# Patient Record
Sex: Female | Born: 1985 | Hispanic: Refuse to answer | Smoking: Never smoker
Health system: Southern US, Community
[De-identification: ages and names within clinical notes are randomized; demographics above are authoritative.]

## PROBLEM LIST (undated history)

## (undated) DIAGNOSIS — E282 Polycystic ovarian syndrome: Secondary | ICD-10-CM

## (undated) DIAGNOSIS — K8020 Calculus of gallbladder without cholecystitis without obstruction: Secondary | ICD-10-CM

## (undated) DIAGNOSIS — R87629 Unspecified abnormal cytological findings in specimens from vagina: Secondary | ICD-10-CM

## (undated) DIAGNOSIS — M329 Systemic lupus erythematosus, unspecified: Secondary | ICD-10-CM

## (undated) DIAGNOSIS — I493 Ventricular premature depolarization: Secondary | ICD-10-CM

## (undated) HISTORY — DX: Systemic lupus erythematosus, unspecified: M32.9

## (undated) HISTORY — PX: CYST EXCISION: SHX5701

## (undated) HISTORY — DX: Ventricular premature depolarization: I49.3

## (undated) HISTORY — DX: Calculus of gallbladder without cholecystitis without obstruction: K80.20

## (undated) HISTORY — DX: Polycystic ovarian syndrome: E28.2

## (undated) HISTORY — DX: Unspecified abnormal cytological findings in specimens from vagina: R87.629

---
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
## 2007-02-05 ENCOUNTER — Inpatient Hospital Stay (HOSPITAL_COMMUNITY): Admission: AD | Admit: 2007-02-05 | Discharge: 2007-02-07 | Payer: Self-pay | Admitting: Family Medicine

## 2007-02-05 ENCOUNTER — Ambulatory Visit: Payer: Self-pay | Admitting: Obstetrics and Gynecology

## 2009-07-08 ENCOUNTER — Emergency Department (HOSPITAL_COMMUNITY): Admission: EM | Admit: 2009-07-08 | Discharge: 2009-07-08 | Payer: Self-pay | Admitting: Emergency Medicine

## 2010-07-29 LAB — URINE MICROSCOPIC-ADD ON

## 2010-07-29 LAB — BASIC METABOLIC PANEL
BUN: 3 mg/dL — ABNORMAL LOW (ref 6–23)
CO2: 30 mEq/L (ref 19–32)
Calcium: 8.9 mg/dL (ref 8.4–10.5)
Chloride: 100 mEq/L (ref 96–112)
Creatinine, Ser: 0.72 mg/dL (ref 0.4–1.2)
GFR calc Af Amer: >60 mL/min (ref >60)
GFR calc non Af Amer: >60 mL/min (ref >60)
Glucose, Bld: 91 mg/dL (ref 70–99)
Potassium: 3.6 mEq/L (ref 3.5–5.1)
Sodium: 140 mEq/L (ref 135–145)

## 2010-07-29 LAB — DIFFERENTIAL
Basophils Absolute: 0 10*3/uL (ref 0.0–0.1)
Basophils Relative: 0 % (ref 0–1)
Eosinophils Absolute: 0.1 10*3/uL (ref 0.0–0.7)
Neutro Abs: 3.9 10*3/uL (ref 1.7–7.7)
Neutrophils Relative %: 77 % (ref 43–77)

## 2010-07-29 LAB — WET PREP, GENITAL: Yeast Wet Prep HPF POC: NONE SEEN

## 2010-07-29 LAB — URINALYSIS, ROUTINE W REFLEX MICROSCOPIC
Bilirubin Urine: NEGATIVE
Glucose, UA: NEGATIVE mg/dL
Ketones, ur: NEGATIVE mg/dL
Leukocytes, UA: NEGATIVE
Nitrite: NEGATIVE
Protein, ur: NEGATIVE mg/dL
Specific Gravity, Urine: 1.01 (ref 1.005–1.030)
Urobilinogen, UA: 0.2 mg/dL (ref 0.0–1.0)
pH: 6 (ref 5.0–8.0)

## 2010-07-29 LAB — CBC
MCHC: 34.1 g/dL (ref 30.0–36.0)
RDW: 13.8 % (ref 11.5–15.5)

## 2010-07-29 LAB — GC/CHLAMYDIA PROBE AMP, GENITAL: Chlamydia, DNA Probe: NEGATIVE

## 2010-07-29 LAB — PREGNANCY, URINE: Preg Test, Ur: NEGATIVE

## 2010-09-21 NOTE — Op Note (Signed)
NAMESHARDE, GOVER               ACCOUNT NO.:  [id number]   MEDICAL RECORD NO.:  [id number]          PATIENT TYPE:  INP   LOCATION:  LDR3                          FACILITY:  [facility]   PHYSICIAN:  Tilda Burrow, M.D. DATE OF BIRTH:  03-13-1986   DATE OF PROCEDURE:  01/29/2005  DATE OF DISCHARGE:                                 OPERATIVE REPORT   DETAILS OF PROCEDURE:  Epidural catheter placement. Continuous lumbar  epidural catheter placed on January 29, 2005 at 7:45 p.m. The patient had  consented to the procedure. Questions were answered to patient's  satisfaction. The patient placed in a sitting position, flexed forward,  local anesthesia applied, after prepping and draping of the back. Loss-of-  resistance technique was used. A continuous lumbar epidural catheter was  placed, with epidural space identified at 4.5 cm beneath the skin. A 5 cc  test dose of 1.5% lidocaine with epinephrine was instilled followed by  insertion of the epidural catheter 3 cm into the epidural space. Removal of  the Tuohy needle, followed by attachment to continuous infusion with a 9-cc  bolus of 0.125% Marcaine solution with 2 mcg/mL of fentanyl. We also  subsequently administered 12 cc per hour as continuous infusion. The patient  had good analgesic effect, allowed cervical exam without difficulty, and  cervix could be stretched to 4 cm manually. Vertex was showing some molding.  Presenting part is at -2 station.   IMPRESSION:  Prognosis for delivery is improved with molding and slow  progress. Will monitor for adequacy of labor. Dr. Despina Hidden to be managing  patient shortly.      Tilda Burrow, M.D.  Electronically Signed     JVF/MEDQ  D:  01/29/2005  T:  01/30/2005  Job:  [id number]

## 2010-09-21 NOTE — Procedures (Signed)
[facility]  Patient:    Kathleen Chan, Kathleen Chan Visit Number: [id number] MRN: [id number]          Service Type: EMS Location: ED Attending Physician:  Rosalyn Charters Dictated by:   Kari Baars, M.D. Proc. Date: 11/05/01 Admit Date:  11/05/2001 Discharge Date: 11/05/2001                            EKG Interpretations  The rhythm is a sinus rhythm with a rate in the 70s.  Voltage is minimally high.  There are small Q-waves inferiorly and laterally which are probably of no significance but clinical correlation is suggested.  Borderline electrocardiogram. Dictated by:   Kari Baars, M.D. Attending Physician:  Rosalyn Charters DD:  11/06/01 TD:  11/10/01 Job: [id number]

## 2010-09-21 NOTE — H&P (Signed)
[name], [name]               ACCOUNT NO.:  [id number]   MEDICAL RECORD NO.:  [id number]          PATIENT TYPE:  INP   LOCATION:  LDR3                          FACILITY:  [facility]   PHYSICIAN:  Tilda Burrow, M.D. DATE OF BIRTH:  October 14, 1985   DATE OF ADMISSION:  01/29/2005  DATE OF DISCHARGE:  LH                                HISTORY & PHYSICAL   HISTORY OF PRESENT ILLNESS:  Kathleen Chan is an 25 year old gravida 1, para 0,  EDC February 10, 2007, at 38 weeks and 3 days with spontaneous rupture of  membranes at approximately 11 o'clock last night who had gross leaking upon  admission.   PAST MEDICAL HISTORY:  Negative.   PAST SURGICAL HISTORY:  Cyst removed under her arm when she was in the first  grade.   ALLERGIES:  FLONASE, KEFLEX GIVES HER ORAL YEAST.   MEDICATIONS:  Prenatal vitamins.   SOCIAL HISTORY:  She is married, and her family is present and supportive.   FAMILY HISTORY:  Positive for hypertension and diabetes.   PHYSICAL EXAMINATION:  Vital signs are stable.  Fetal heart rate pattern is  stable with accelerations clear.  Fluid noted upon exam. Cervix is 1 cm  thick.  Presenting part is at least -2 and is vertex.   PLAN:  Will admit.  ___________ for labor because she is having very  prodromal labor pattern, if any.  Possible IV antibiotics for prophylaxis  due to prolonged rupture.      Kathleen Chan, Kathleen Chan      Tilda Burrow, M.D.  Electronically Signed    DL/MEDQ  D:  16/02/9603  T:  01/29/2005  Job:  [id number]   cc:   [facility]

## 2010-09-21 NOTE — Op Note (Signed)
NAMEDESHARA, Chan               ACCOUNT NO.:  [id number]   MEDICAL RECORD NO.:  [id number]          PATIENT TYPE:  INP   LOCATION:  A401                          FACILITY:  [facility]   PHYSICIAN:  Lazaro Arms, M.D.   DATE OF BIRTH:  08-09-1985   DATE OF PROCEDURE:  01/30/2005  DATE OF DISCHARGE:  02/01/2005                                 OPERATIVE REPORT   DELIVERY NOTE:  The patient progressed normally through labor. She was found  to be complete. She is nulliparous at 38-1/[redacted] weeks gestation, spontaneous  rupture of membranes. Again, she had an epidural placed. She was getting  good pain relief. She was delivered over an intact peritoneum on January 30, 2005 at 0228. The infant had Apgars of 9 and 9. There was a three-vessel  cord. The infant weighed 5 pounds 11 ounces. There was a loose nuchal cord  x1. The infant underwent routine neonatal resuscitation. There was a right  periurethral laceration which was repaired with 3-0 Monocryl without  difficulty. The placenta was delivered spontaneously. The uterus was firm,  and there was appropriate bleeding. The patient will undergo routine  postpartum care. The epidural catheter was removed, the blue tip intact.      Lazaro Arms, M.D.  Electronically Signed     LHE/MEDQ  D:  04/03/2005  T:  04/04/2005  Job:  [id number]

## 2011-02-14 LAB — CBC
HCT: 33.7 — ABNORMAL LOW
Hemoglobin: 11.2 — ABNORMAL LOW
Hemoglobin: 9.8 — ABNORMAL LOW
MCHC: 33.5
MCV: 81.9
MCV: 82.5
Platelets: 182
RDW: 15.3 — ABNORMAL HIGH
RDW: 15.4 — ABNORMAL HIGH
WBC: 10

## 2013-08-04 ENCOUNTER — Ambulatory Visit (INDEPENDENT_AMBULATORY_CARE_PROVIDER_SITE_OTHER): Payer: Managed Care, Other (non HMO) | Admitting: General Practice

## 2013-08-04 ENCOUNTER — Encounter: Payer: Self-pay | Admitting: General Practice

## 2013-08-04 ENCOUNTER — Telehealth: Payer: Self-pay | Admitting: Family Medicine

## 2013-08-04 VITALS — BP 99/63 | HR 55 | Temp 97.4°F | Ht 60.0 in | Wt 199.0 lb

## 2013-08-04 DIAGNOSIS — S161XXA Strain of muscle, fascia and tendon at neck level, initial encounter: Secondary | ICD-10-CM

## 2013-08-04 DIAGNOSIS — S46819A Strain of other muscles, fascia and tendons at shoulder and upper arm level, unspecified arm, initial encounter: Secondary | ICD-10-CM

## 2013-08-04 DIAGNOSIS — S43499A Other sprain of unspecified shoulder joint, initial encounter: Secondary | ICD-10-CM

## 2013-08-04 DIAGNOSIS — S139XXA Sprain of joints and ligaments of unspecified parts of neck, initial encounter: Secondary | ICD-10-CM

## 2013-08-04 MED ORDER — CYCLOBENZAPRINE HCL 10 MG PO TABS: 10.0000 mg | ORAL_TABLET | Freq: Two times a day (BID) | ORAL | Status: DC | PRN

## 2013-08-04 NOTE — Telephone Encounter (Signed)
appt given  

## 2013-08-04 NOTE — Progress Notes (Signed)
   Subjective:    Patient ID: Kathleen DodgeHeather F Chan, female    DOB: [birth year], 28 y.o.   MRN: [id number]  HPI Presents today with complaints of neck and upper back pain. Reports carrying a book bag on right shoulder, walking up stairs and noticed pain shortly after. Reports upper back muscle tightness.     Review of Systems  Constitutional: Negative for fever and chills.  Respiratory: Negative for chest tightness and shortness of breath.   Cardiovascular: Negative for chest pain and palpitations.  Musculoskeletal:       Upper back and neck pain       Objective:   Physical Exam  Constitutional: She is oriented to person, place, and time. She appears well-developed and well-nourished.  Cardiovascular: Normal rate, regular rhythm and normal heart sounds.   Pulmonary/Chest: Effort normal and breath sounds normal. No respiratory distress. She exhibits no tenderness.  Musculoskeletal: She exhibits tenderness.  Trapezius muscle tenderness and tightness noted  Neurological: She is alert and oriented to person, place, and time.  Skin: Skin is warm and dry.  Psychiatric: She has a normal mood and affect.          Assessment & Plan:  1. Neck muscle strain, 2. Trapezius muscle strain - cyclobenzaprine (FLEXERIL) 10 MG tablet; Take 1 tablet (10 mg total) by mouth 2 (two) times daily as needed for muscle spasms.  Dispense: 20 tablet; Refill: 0 -home care instructions provided and discussed -RTO if symptoms worsen or unresolved Patient verbalized understanding Coralie KeensMae E. Tobi Groesbeck, FNP-C

## 2013-08-04 NOTE — Patient Instructions (Signed)

## 2013-08-09 ENCOUNTER — Telehealth: Payer: Self-pay | Admitting: General Practice

## 2013-08-09 ENCOUNTER — Encounter: Payer: Self-pay | Admitting: General Practice

## 2013-08-09 NOTE — Telephone Encounter (Signed)
Patient neck is popping and hurting do you want a referral set up or do you want her to come back in

## 2013-08-09 NOTE — Telephone Encounter (Signed)
Note ready for pick up 

## 2013-09-01 ENCOUNTER — Encounter: Payer: Self-pay | Admitting: Family Medicine

## 2013-09-01 ENCOUNTER — Ambulatory Visit (INDEPENDENT_AMBULATORY_CARE_PROVIDER_SITE_OTHER): Payer: Managed Care, Other (non HMO) | Admitting: Family Medicine

## 2013-09-01 VITALS — BP 109/67 | HR 77 | Temp 98.3°F | Ht 60.0 in | Wt 195.6 lb

## 2013-09-01 DIAGNOSIS — G43909 Migraine, unspecified, not intractable, without status migrainosus: Secondary | ICD-10-CM

## 2013-09-01 DIAGNOSIS — J329 Chronic sinusitis, unspecified: Secondary | ICD-10-CM

## 2013-09-01 DIAGNOSIS — M542 Cervicalgia: Secondary | ICD-10-CM

## 2013-09-01 MED ORDER — KETOROLAC TROMETHAMINE 30 MG/ML IJ SOLN
30.0000 mg | Freq: Once | INTRAMUSCULAR | Status: AC
  Administered 2013-09-01: 30 mg via INTRAMUSCULAR

## 2013-09-01 MED ORDER — AMOXICILLIN 875 MG PO TABS: 875.0000 mg | ORAL_TABLET | Freq: Two times a day (BID) | ORAL | Status: DC

## 2013-09-01 MED ORDER — HYDROCODONE-ACETAMINOPHEN 5-325 MG PO TABS: 1.0000 | ORAL_TABLET | Freq: Four times a day (QID) | ORAL | Status: DC | PRN

## 2013-09-01 MED ORDER — SUMATRIPTAN SUCCINATE 100 MG PO TABS: 100.0000 mg | ORAL_TABLET | ORAL | Status: DC | PRN

## 2013-09-01 MED ORDER — ONDANSETRON 8 MG PO TBDP: 8.0000 mg | ORAL_TABLET | Freq: Three times a day (TID) | ORAL | Status: DC | PRN

## 2013-09-01 NOTE — Progress Notes (Signed)
   Subjective:    Patient ID: Elinor DodgeHeather F Diniz, female    DOB: Jun 26, 1985, 28 y.o.   MRN: [id number]  HPI  This 28 y.o. female presents for evaluation of migraine headaches.  She does not have hx of headaches but has been having several over past 2 weeks.  She c/o pain in her teeth, pressure behind her eyes and neck discomfort.  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  She has been having pounding headache and has phonophotophobia and nausea.  She has had to stop working today and has come in for a check up.  Review of Systems C/o headache and cervicalgia   No chest pain, SOB, HA, dizziness, vision change, N/V, diarrhea, constipation, dysuria, urinary urgency or frequency, myalgias, arthralgias or rash.  Objective:   Physical Exam Vital signs noted  Well developed well nourished female.  HEENT - Head atraumatic Normocephalic                Throat - oropharanx wnl Respiratory - Lungs CTA bilateral Cardiac - RRR S1 and S2 without murmur GI - Abdomen soft Nontender and bowel sounds active x 4 Extremities - No edema. Neuro - Grossly intact. MS - TTP myofascial region and cervical spine paraspinous muscles      Assessment & Plan:  Sinusitis - Plan: amoxicillin (AMOXIL) 875 MG tablet  Migraine - Plan: HYDROcodone-acetaminophen (NORCO) 5-325 MG per tablet, ondansetron (ZOFRAN ODT) 8 MG disintegrating tablet, SUMAtriptan (IMITREX) 100 MG tablet, ketorolac (TORADOL) 30 MG/ML injection 30 mg  Cervicalgia - Plan: ketorolac (TORADOL) 30 MG/ML injection 30 mg.  Discussed taking motrin 600mg  one po tid for a week.  Deatra CanterWilliam J Kaj Vasil FNP

## 2014-01-13 ENCOUNTER — Ambulatory Visit (INDEPENDENT_AMBULATORY_CARE_PROVIDER_SITE_OTHER): Payer: Managed Care, Other (non HMO) | Admitting: Family Medicine

## 2014-01-13 ENCOUNTER — Encounter: Payer: Self-pay | Admitting: Family Medicine

## 2014-01-13 VITALS — BP 109/66 | HR 86 | Temp 97.9°F | Ht 60.0 in | Wt 195.0 lb

## 2014-01-13 DIAGNOSIS — J0111 Acute recurrent frontal sinusitis: Secondary | ICD-10-CM

## 2014-01-13 DIAGNOSIS — J0110 Acute frontal sinusitis, unspecified: Secondary | ICD-10-CM

## 2014-01-13 MED ORDER — LEVOFLOXACIN 500 MG PO TABS: 500.0000 mg | ORAL_TABLET | Freq: Every day | ORAL | Status: DC

## 2014-01-13 MED ORDER — METHYLPREDNISOLONE (PAK) 4 MG PO TABS: ORAL_TABLET | ORAL | Status: DC

## 2014-01-13 NOTE — Progress Notes (Signed)
   Subjective:    Patient ID: Kathleen Chan, female    DOB: 1985/12/17, 28 y.o.   MRN: [id number]  HPI C/o sinus pressure uri and migraine headache.  She missed work Tuesday 01/11/14 and today. She needs note for work.   Review of Systems C/o sinus headache   No chest pain, SOB, HA, dizziness, vision change, N/V, diarrhea, constipation, dysuria, urinary urgency or frequency, myalgias, arthralgias or rash.  Objective:   Physical Exam Vital signs noted  Well developed well nourished female.  HEENT - Head atraumatic Normocephalic                Eyes - PERRLA, Conjuctiva - clear Sclera- Clear EOMI                Ears - EAC's Wnl TM's Wnl Gross Hearing WNL                Throat - oropharanx wnl Respiratory - Lungs CTA bilateral Cardiac - RRR S1 and S2 without murmur GI - Abdomen soft Nontender and bowel sounds active x 4        Assessment & Plan:  Acute recurrent frontal sinusitis - Plan: levofloxacin (LEVAQUIN) 500 MG tablet, methylPREDNIsolone (MEDROL DOSPACK) 4 MG tablet Note for 01/11/14 and today given to patient.  Deatra Canter FNP

## 2014-06-09 ENCOUNTER — Ambulatory Visit: Payer: Managed Care, Other (non HMO) | Admitting: Family Medicine

## 2014-06-10 ENCOUNTER — Encounter: Payer: Self-pay | Admitting: Family Medicine

## 2014-06-10 ENCOUNTER — Ambulatory Visit (INDEPENDENT_AMBULATORY_CARE_PROVIDER_SITE_OTHER): Payer: Managed Care, Other (non HMO) | Admitting: Family Medicine

## 2014-06-10 VITALS — BP 122/80 | HR 109 | Temp 97.8°F | Ht 60.0 in | Wt 196.6 lb

## 2014-06-10 DIAGNOSIS — L03119 Cellulitis of unspecified part of limb: Secondary | ICD-10-CM

## 2014-06-10 MED ORDER — CEFTRIAXONE SODIUM 1 G IJ SOLR
1.0000 g | INTRAMUSCULAR | Status: DC
  Administered 2014-06-10: 1 g via INTRAMUSCULAR

## 2014-06-10 MED ORDER — SULFAMETHOXAZOLE-TRIMETHOPRIM 800-160 MG PO TABS: 1.0000 | ORAL_TABLET | Freq: Two times a day (BID) | ORAL | Status: DC

## 2014-06-10 MED ORDER — CYANOCOBALAMIN 1000 MCG/ML IJ SOLN: 1000.0000 ug | INTRAMUSCULAR | Status: DC

## 2014-06-10 MED ORDER — FLUCONAZOLE 150 MG PO TABS: 150.0000 mg | ORAL_TABLET | Freq: Once | ORAL | Status: DC

## 2014-06-10 MED ORDER — CEFTRIAXONE SODIUM 1 G IJ SOLR: 1.0000 g | Freq: Once | INTRAMUSCULAR | Status: DC

## 2014-06-10 NOTE — Progress Notes (Signed)
   Subjective:    Patient ID: Elinor DodgeHeather F Wilmouth, female    DOB: [birth year], 29 y.o.   MRN: [id number]  HPI C/o left inner thigh discomfort from infection.  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  Review of Systems  Constitutional: Negative for fever.  HENT: Negative for ear pain.   Eyes: Negative for discharge.  Respiratory: Negative for cough.   Cardiovascular: Negative for chest pain.  Gastrointestinal: Negative for abdominal distention.  Endocrine: Negative for polyuria.  Genitourinary: Negative for difficulty urinating.  Musculoskeletal: Negative for gait problem and neck pain.  Skin: Negative for color change and rash.  Neurological: Negative for speech difficulty and headaches.  Psychiatric/Behavioral: Negative for agitation.       Objective:    BP 122/80 mmHg  Pulse 109  Temp(Src) 97.8 F (36.6 C) (Oral)  Ht 5' (1.524 m)  Wt 196 lb 9.6 oz (89.177 kg)  BMI 38.40 kg/m2 Physical Exam  Left inner thigh with erythema and tenderness for approx 6 cm diameter w/o fluctuance.        Assessment & Plan:     ICD-9-CM ICD-10-CM   1. Cellulitis of lower extremity, unspecified laterality 682.6 L03.119 cyanocobalamin ((VITAMIN B-12)) injection 1,000 mcg     sulfamethoxazole-trimethoprim (BACTRIM DS,SEPTRA DS) 800-160 MG per tablet     fluconazole (DIFLUCAN) 150 MG tablet   Continue to use heat and follow up if cyst or boil develops.  Work note to be out of work [employer] manag[employer]ight.  No Follow-up on file.  Deatra CanterWilliam J Terence Googe FNP

## 2014-06-10 NOTE — Addendum Note (Signed)
Addended by: Fawn KirkHOLT, CATHY on: 06/10/2014 09:55 AM   Modules accepted: Orders, Medications

## 2014-06-10 NOTE — Addendum Note (Signed)
Addended by: Fawn KirkHOLT, CATHY on: 06/10/2014 09:15 AM   Modules accepted: Orders

## 2014-12-07 ENCOUNTER — Encounter: Payer: Self-pay | Admitting: Women's Health

## 2014-12-07 ENCOUNTER — Ambulatory Visit (INDEPENDENT_AMBULATORY_CARE_PROVIDER_SITE_OTHER): Payer: Managed Care, Other (non HMO) | Admitting: Women's Health

## 2014-12-07 VITALS — BP 124/70 | HR 72 | Wt 195.0 lb

## 2014-12-07 DIAGNOSIS — N921 Excessive and frequent menstruation with irregular cycle: Secondary | ICD-10-CM | POA: Diagnosis not present

## 2014-12-07 DIAGNOSIS — E669 Obesity, unspecified: Secondary | ICD-10-CM | POA: Diagnosis not present

## 2014-12-07 DIAGNOSIS — L680 Hirsutism: Secondary | ICD-10-CM

## 2014-12-07 MED ORDER — NORGESTIMATE-ETH ESTRADIOL 0.25-35 MG-MCG PO TABS: 1.0000 | ORAL_TABLET | Freq: Every day | ORAL | Status: DC

## 2014-12-07 NOTE — Progress Notes (Signed)
Patient ID: Kathleen Chan, female   DOB: [birth year], 29 y.o.   MRN: [id number]   [facility] Visit  Patient name: Kathleen Chan MRN [id number]  Date of birth: 1985/09/12  CC & HPI:  Kathleen Chan is a 29 y.o. G87P2002 Caucasian female presenting today for report of heavy irregular periods and facial hair. Can go months w/o period, some periods are light, some are super heavy and changes saturated tampon & pad q 1hr w/ large clots. Has noticed a lot of facial/chin/chest hair growth. This period started 7/16 and is still going. Does not desire pregnancy any time soon. Not on any contraception currently and has not gotten pregnant w/ husband since birth of last child.  Last pap ~35yrs ago. Had lipid panel for work recently, all normal.  Does not smoke, no h/o HTN, DVT/PE, CVA, MI, or migraines w/ aura.   Pertinent History Reviewed:  Medical & Surgical Hx:   History reviewed. No pertinent past medical history. Past Surgical History  Procedure Laterality Date  . Cyst excision Left     AXILLARY   Medications: Reviewed & Updated - see associated section Social History: Reviewed -  reports that she has never smoked. She does not have any smokeless tobacco history on file.  Objective Findings:  Vitals: BP 124/70 mmHg  Pulse 72  Wt 195 lb (88.451 kg)  LMP 11/19/2014 Body mass index is 38.08 kg/(m^2).   Physical Examination: General appearance - alert, well appearing, and in no distress Skin - +hirsutism chin/neck/sides of face, chest  No results found for this or any previous visit (from the past 24 hour(s)).   Assessment & Plan:  A:   Menorrhagia w/ irregular cycles  Hirsutism  Obesity P:  Rx Sprintec w/ 11RF  CBC, CMP, TSH today  F/U 2wks for pap & physical  Will call when period stops for pelvic u/s  Marge Duncans CNM, WHNP-BC 12/07/2014 2:21 PM

## 2014-12-07 NOTE — Patient Instructions (Signed)
Oral Contraception Use Oral contraceptive pills (OCPs) are medicines taken to prevent pregnancy. OCPs work by preventing the ovaries from releasing eggs. The hormones in OCPs also cause the cervical mucus to thicken, preventing the sperm from entering the uterus. The hormones also cause the uterine lining to become thin, not allowing a fertilized egg to attach to the inside of the uterus. OCPs are highly effective when taken exactly as prescribed. However, OCPs do not prevent sexually transmitted diseases (STDs). Safe sex practices, such as using condoms along with an OCP, can help prevent STDs. Before taking OCPs, you may have a physical exam and Pap test. Your health care provider may also order blood tests if necessary. Your health care provider will make sure you are a good candidate for oral contraception. Discuss with your health care provider the possible side effects of the OCP you may be prescribed. When starting an OCP, it can take 2 to 3 months for the body to adjust to the changes in hormone levels in your body.  HOW TO TAKE ORAL CONTRACEPTIVE PILLS Your health care provider may advise you on how to start taking the first cycle of OCPs. Otherwise, you can:   Start on day 1 of your menstrual period. You will not need any backup contraceptive protection with this start time.   Start on the first Sunday after your menstrual period or the day you get your prescription. In these cases, you will need to use backup contraceptive protection for the first week.   Start the pill at any time of your cycle. If you take the pill within 5 days of the start of your period, you are protected against pregnancy right away. In this case, you will not need a backup form of birth control. If you start at any other time of your menstrual cycle, you will need to use another form of birth control for 7 days. If your OCP is the type called a minipill, it will protect you from pregnancy after taking it for 2 days (48  hours). After you have started taking OCPs:   If you forget to take 1 pill, take it as soon as you remember. Take the next pill at the regular time.   If you miss 2 or more pills, call your health care provider because different pills have different instructions for missed doses. Use backup birth control until your next menstrual period starts.   If you use a 28-day pack that contains inactive pills and you miss 1 of the last 7 pills (pills with no hormones), it will not matter. Throw away the rest of the non-hormone pills and start a new pill pack.  No matter which day you start the OCP, you will always start a new pack on that same day of the week. Have an extra pack of OCPs and a backup contraceptive method available in case you miss some pills or lose your OCP pack.  HOME CARE INSTRUCTIONS   Do not smoke.   Always use a condom to protect against STDs. OCPs do not protect against STDs.   Use a calendar to mark your menstrual period days.   Read the information and directions that came with your OCP. Talk to your health care provider if you have questions.  SEEK MEDICAL CARE IF:   You develop nausea and vomiting.   You have abnormal vaginal discharge or bleeding.   You develop a rash.   You miss your menstrual period.   You are losing   your hair.   You need treatment for mood swings or depression.   You get dizzy when taking the OCP.   You develop acne from taking the OCP.   You become pregnant.  SEEK IMMEDIATE MEDICAL CARE IF:   You develop chest pain.   You develop shortness of breath.   You have an uncontrolled or severe headache.   You develop numbness or slurred speech.   You develop visual problems.   You develop pain, redness, and swelling in the legs.  Document Released: 04/11/2011 Document Revised: 09/06/2013 Document Reviewed: 10/11/2012 ExitCare Patient Information 2015 [employer]. This information is not intended to replace  advice given to you by your health care provider. Make sure you discuss any questions you have with your health care provider.  

## 2014-12-08 LAB — COMPREHENSIVE METABOLIC PANEL
A/G RATIO: 1.9 (ref 1.1–2.5)
ALT: 25 IU/L (ref 0–32)
AST: 16 IU/L (ref 0–40)
Albumin: 4.5 g/dL (ref 3.5–5.5)
Alkaline Phosphatase: 99 IU/L (ref 39–117)
BUN / CREAT RATIO: 8 (ref 8–20)
BUN: 7 mg/dL (ref 6–20)
Bilirubin Total: <0.2 mg/dL (ref 0.0–1.2)
CALCIUM: 9.6 mg/dL (ref 8.7–10.2)
CHLORIDE: 102 mmol/L (ref 97–108)
CO2: 27 mmol/L (ref 18–29)
Creatinine, Ser: 0.88 mg/dL (ref 0.57–1.00)
GFR calc Af Amer: 103 mL/min/{1.73_m2} (ref >59)
GFR calc non Af Amer: 90 mL/min/{1.73_m2} (ref >59)
GLOBULIN, TOTAL: 2.4 g/dL (ref 1.5–4.5)
Glucose: 86 mg/dL (ref 65–99)
Potassium: 4 mmol/L (ref 3.5–5.2)
SODIUM: 143 mmol/L (ref 134–144)
TOTAL PROTEIN: 6.9 g/dL (ref 6.0–8.5)

## 2014-12-08 LAB — TSH: TSH: 4.62 u[IU]/mL — AB (ref 0.450–4.500)

## 2014-12-08 LAB — CBC
HEMOGLOBIN: 11 g/dL — AB (ref 11.1–15.9)
Hematocrit: 35.5 % (ref 34.0–46.6)
MCH: 26.5 pg — AB (ref 26.6–33.0)
MCHC: 31 g/dL — ABNORMAL LOW (ref 31.5–35.7)
MCV: 86 fL (ref 79–97)
PLATELETS: 253 10*3/uL (ref 150–379)
RBC: 4.15 x10E6/uL (ref 3.77–5.28)
RDW: 14.4 % (ref 12.3–15.4)
WBC: 7.4 10*3/uL (ref 3.4–10.8)

## 2014-12-09 ENCOUNTER — Telehealth: Payer: Self-pay | Admitting: *Deleted

## 2014-12-09 NOTE — Telephone Encounter (Signed)
Line busy @ 10:55 am. [employer]

## 2014-12-12 NOTE — Telephone Encounter (Signed)
Spoke with pt letting her know Kathleen Chan is out of the office today and she needs to review results. I will route this note to Kathleen Chan and she will review results tomorrow and call pt with recommendations. Pt voiced understanding. JSY

## 2014-12-14 ENCOUNTER — Telehealth: Payer: Self-pay | Admitting: Women's Health

## 2014-12-14 NOTE — Telephone Encounter (Signed)
LM for pt to return call, need to notify of labs.  Cheral Marker, CNM, [facility] 12/14/2014 8:48 AM

## 2014-12-20 ENCOUNTER — Telehealth: Payer: Self-pay | Admitting: Women's Health

## 2014-12-20 ENCOUNTER — Other Ambulatory Visit: Payer: Self-pay | Admitting: Women's Health

## 2014-12-20 DIAGNOSIS — R7989 Other specified abnormal findings of blood chemistry: Secondary | ICD-10-CM

## 2014-12-20 NOTE — Telephone Encounter (Signed)
LM for pt to return call, need to notify of labs and have come back in to repeat TSH & get serum free T4. I will also send my chart note and a letter to her listed address.  Cheral Marker, CNM, [facility] 12/20/2014 3:02 PM

## 2014-12-26 ENCOUNTER — Ambulatory Visit (INDEPENDENT_AMBULATORY_CARE_PROVIDER_SITE_OTHER): Payer: Managed Care, Other (non HMO) | Admitting: Women's Health

## 2014-12-26 ENCOUNTER — Encounter: Payer: Self-pay | Admitting: Women's Health

## 2014-12-26 ENCOUNTER — Other Ambulatory Visit (HOSPITAL_COMMUNITY)
Admission: RE | Admit: 2014-12-26 | Discharge: 2014-12-26 | Disposition: A | Discharge status: Home / Self Care | Payer: Managed Care, Other (non HMO) | Source: Ambulatory Visit | Attending: Obstetrics & Gynecology | Admitting: Obstetrics & Gynecology

## 2014-12-26 VITALS — BP 126/68 | HR 76 | Ht 61.75 in | Wt 194.0 lb

## 2014-12-26 DIAGNOSIS — Z113 Encounter for screening for infections with a predominantly sexual mode of transmission: Secondary | ICD-10-CM | POA: Insufficient documentation

## 2014-12-26 DIAGNOSIS — D649 Anemia, unspecified: Secondary | ICD-10-CM

## 2014-12-26 DIAGNOSIS — Z01419 Encounter for gynecological examination (general) (routine) without abnormal findings: Secondary | ICD-10-CM | POA: Diagnosis not present

## 2014-12-26 DIAGNOSIS — Z1151 Encounter for screening for human papillomavirus (HPV): Secondary | ICD-10-CM | POA: Insufficient documentation

## 2014-12-26 DIAGNOSIS — Z01411 Encounter for gynecological examination (general) (routine) with abnormal findings: Secondary | ICD-10-CM | POA: Insufficient documentation

## 2014-12-26 DIAGNOSIS — L680 Hirsutism: Secondary | ICD-10-CM

## 2014-12-26 DIAGNOSIS — R7989 Other specified abnormal findings of blood chemistry: Secondary | ICD-10-CM | POA: Insufficient documentation

## 2014-12-26 NOTE — Progress Notes (Signed)
Patient ID: Kathleen Chan, female   DOB: Feb 13, 1986, 28 y.o.   MRN: [id number] Subjective:   Kathleen Chan is a 29 y.o. G27P2002 Caucasian female here for a routine well-woman exam.  Patient's last menstrual period was 11/19/2014.    Current complaints: started Sprintec right after last visit, is on third week, still bleeding from period that began 7/16, but has slowed. Hirsutism- mustache, chin, breasts, abd.  PCP: [facility]       TSH on 8/3 was elevated- needs repeat TSH and serum free T4- was unsuccessful in contacting pt- sent letter via mychart and mail- which she received. Slightly anemic at 11.0- doesn't like red meats- to increase green leafy vegs/beans & cook in cast iron skillet.  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  Past Medical History History reviewed. No pertinent past medical history.  Past Surgical History Past Surgical History  Procedure Laterality Date  . Cyst excision Left     AXILLARY    Gynecologic History [id number]  Patient's last menstrual period was 11/19/2014. Contraception: abstinence and COCs for period managment Last Pap: 2011. Results were: normal Last mammogram: never. Results were: n/a Last TCS: never  Obstetric History OB History  Gravida Para Term Preterm AB SAB TAB Ectopic Multiple Living  0 0 0 0 0 0 4    # Outcome Date GA Lbr Len/2nd Weight Sex Delivery Anes PTL Lv  2 Term 2008 [redacted]w[redacted]d  7 lb 10 oz (3.459 kg) F  EPI  Y  1 Term 2006 [redacted]w[redacted]d  6 lb (2.722 kg) F Vag-Spont EPI  Y       Current Medications Current Outpatient Prescriptions on File Prior to Visit  Medication Sig Dispense Refill  . norgestimate-ethinyl estradiol (ORTHO-CYCLEN,SPRINTEC,PREVIFEM) 0.25-35 MG-MCG tablet Take 1 tablet by mouth daily. 1 Package 11  . fluconazole (DIFLUCAN) 150 MG tablet Take 1 tablet (150 mg total) by mouth once. (Patient not taking: Reported on 12/07/2014) 2 tablet 1  . sulfamethoxazole-trimethoprim (BACTRIM DS,SEPTRA DS) 800-160 MG per tablet Take 1 tablet by mouth 2 (two) times daily. (Patient not taking: Reported on 12/07/2014) 20 tablet 0   Current Facility-Administered Medications on File Prior to Visit  Medication Dose Route Frequency Provider Last Rate Last Dose  . cefTRIAXone (ROCEPHIN) injection 1 g  1 g Intramuscular Q24H Deatra Canter, FNP   1 g at 06/10/14 1478    Review of Systems Patient denies any headaches, blurred vision, shortness of breath, chest pain, abdominal pain, problems with bowel movements,or intercourse. States she holds her bladder for long periods of time (12-13hrs) at work- has noticed when she sits down to void it is hard for her to actually start stream  Objective:  BP 126/68 mmHg  Pulse 76  Ht 5' 1.75" (1.568 m)  Wt 194 lb (87.998 kg)  BMI 35.79 kg/m2  LMP 11/19/2014 Physical Exam  General:  Well developed, well nourished, no acute distress. She is alert and oriented x3. Skin:  Warm and dry. +Hirsutism face/chin/chest/abd, no acanthosis nigricans, no hair loss.  Neck:  Midline trachea, no thyromegaly or nodules Cardiovascular:  Regular rate and rhythm, no murmur heard Lungs:  Effort normal, all lung fields clear to auscultation bilaterally Breasts:  No dominant palpable mass, retraction, or nipple discharge Abdomen:  Soft, no hepatosplenomegaly or masses. Slightly tender suprapubic Pelvic:  External genitalia is normal in appearance.  The vagina is normal in appearance. The cervix is bulbous, no CMT.  Thin prep pap is done w/ reflex HR  HPV cotesting. Uterus is felt to be normal size, shape, and contour.  No adnexal masses or tenderness noted. Extremities:  No swelling or varicosities noted Psych:  She has a normal mood and affect  Assessment:   Healthy well-woman exam Hirsutism, irregular periods c/w PCOS STD screen Elevated TSH Slight anemia  Plan:  Continue sprintec, can take few months to regulate periods To call when period stops/slows for pelvic u/s d/t irregular periods, PCOS sx GC/CT from pap today, HIV, RPR, HSV2, HepB&C, TSH, free T4 today Empty bladder frequently q 2-3 hours Increase green leafy vegs/beans, cook in cast iron skillet for slight anemia (doesn't like red meats) Then F/U 72yr for physical, or sooner if needed Mammogram @28yo  or sooner if problems Colonoscopy @29yo  or sooner if problems  Marge Duncans CNM, WHNP-BC 12/26/2014 10:20 AM

## 2014-12-26 NOTE — Patient Instructions (Signed)
Call to make appointment for pelvic ultrasound when bleeding stops/slows

## 2014-12-27 ENCOUNTER — Encounter: Payer: Self-pay | Admitting: *Deleted

## 2014-12-28 LAB — CYTOLOGY - PAP

## 2015-01-02 ENCOUNTER — Telehealth: Payer: Self-pay | Admitting: Women's Health

## 2015-01-02 DIAGNOSIS — R87619 Unspecified abnormal cytological findings in specimens from cervix uteri: Secondary | ICD-10-CM | POA: Insufficient documentation

## 2015-01-02 DIAGNOSIS — R87618 Other abnormal cytological findings on specimens from cervix uteri: Secondary | ICD-10-CM

## 2015-01-02 NOTE — Telephone Encounter (Signed)
Notified pt of abnormal pap/need for colpo- switched to front to schedule. Hasn't gotten labwork yet- will get when she comes for colpo.  Cheral Marker, CNM, WHNP-BC 01/02/2015 2:22 PM

## 2015-01-03 ENCOUNTER — Encounter: Payer: Self-pay | Admitting: *Deleted

## 2015-01-03 ENCOUNTER — Telehealth: Payer: Self-pay | Admitting: Women's Health

## 2015-01-03 NOTE — Telephone Encounter (Addendum)
Pt states she is having very heavy vaginal bleeding and is bleeding through pads and clothes and wants a work note for today.  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   Please advise.

## 2015-01-03 NOTE — Telephone Encounter (Deleted)
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

## 2015-01-03 NOTE — Telephone Encounter (Signed)
Pt informed note will be ready at front desk.

## 2015-01-16 ENCOUNTER — Ambulatory Visit (INDEPENDENT_AMBULATORY_CARE_PROVIDER_SITE_OTHER): Payer: Managed Care, Other (non HMO) | Admitting: Obstetrics and Gynecology

## 2015-01-16 ENCOUNTER — Other Ambulatory Visit: Payer: Self-pay | Admitting: Obstetrics and Gynecology

## 2015-01-16 ENCOUNTER — Encounter: Payer: Self-pay | Admitting: Obstetrics and Gynecology

## 2015-01-16 ENCOUNTER — Other Ambulatory Visit (HOSPITAL_COMMUNITY)
Admission: RE | Admit: 2015-01-16 | Discharge: 2015-01-16 | Disposition: A | Discharge status: Home / Self Care | Payer: Managed Care, Other (non HMO) | Source: Ambulatory Visit | Attending: Obstetrics and Gynecology | Admitting: Obstetrics and Gynecology

## 2015-01-16 VITALS — BP 100/62 | Ht 61.0 in | Wt 195.0 lb

## 2015-01-16 DIAGNOSIS — Z3200 Encounter for pregnancy test, result unknown: Secondary | ICD-10-CM

## 2015-01-16 DIAGNOSIS — N870 Mild cervical dysplasia: Secondary | ICD-10-CM

## 2015-01-16 DIAGNOSIS — Z01411 Encounter for gynecological examination (general) (routine) with abnormal findings: Secondary | ICD-10-CM | POA: Diagnosis present

## 2015-01-16 NOTE — Progress Notes (Signed)
Patient ID: Kathleen Chan, female   DOB: 16-Feb-1986, 29 y.o.   MRN: [id number] Pt here today for a colposcopy. UPT is negatvie.

## 2015-01-16 NOTE — Progress Notes (Signed)
Patient ID: Kathleen Chan, female   DOB: February 28, 1986, 29 y.o.   MRN: [id number]  Kathleen Chan 29 y.o. [id number] here for colposcopy for glandular cell abnormality (AGUS) and atypical squamous cells of undetermined significance found on pap smear on 12/26/2014. At the time of her pap smear, pt was experiencing vaginal bleeding.   Discussed role for HPV in cervical dysplasia, need for surveillance. Pregnancy test negative. Repeat PAP smear collected.  Patient given informed consent, signed copy in the chart, time out was performed.  Placed in lithotomy position. Cervix viewed with speculum and colposcope after application of acetic acid.   Colposcopy adequate? Yes Normal glandular cells of the endocercix; biopsies obtained at anterior lip of the cervix.   ECC specimen obtained. All specimens were labelled and sent to pathology.  Colposcopy IMPRESSION: CIN-I  Patient was given post procedure instructions. Will follow up pathology and manage accordingly.  Routine preventative health maintenance measures emphasized.  Endometrial Biopsy: Patient given informed consent, signed copy in the chart, time out was performed. Time out taken. The patient was placed in the lithotomy position and the cervix brought into view with sterile speculum.  Portio of cervix cleansed x 2 with betadine swabs.  A tenaculum was placed in the anterior lip of the cervix. The uterus was sounded for depth of 8 cm. Milex uterine Explora 3 mm was introduced to into the uterus, suction created,  and an endometrial sample was obtained. All equipment was removed and accounted for.   The patient tolerated the procedure well.   Patient given post procedure instructions.    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    This chart was scribed for Tilda Burrow, MD by  Gwenyth Ober, Medical Scribe. This patient was seen in room 2 and the patient's care was started at 9:22 AM.   I personally performed the services described in this documentation, which was SCRIBED in my presence. The recorded information has been reviewed and considered accurate. It has been edited as necessary during review. Tilda Burrow, MD

## 2015-01-17 ENCOUNTER — Telehealth: Payer: Self-pay | Admitting: Obstetrics and Gynecology

## 2015-01-17 NOTE — Telephone Encounter (Signed)
I left a message on Juhi's phone, going over results, and recommendations, as posted in MyChart. Will advise pap and cotesting for HPV in 1 and 2 years.

## 2015-01-17 NOTE — Telephone Encounter (Signed)
Patient is in recall

## 2015-01-18 LAB — CYTOLOGY - PAP

## 2015-07-09 ENCOUNTER — Emergency Department (HOSPITAL_COMMUNITY): Payer: Managed Care, Other (non HMO)

## 2015-07-09 ENCOUNTER — Emergency Department (HOSPITAL_COMMUNITY)
Admission: EM | Admit: 2015-07-09 | Discharge: 2015-07-09 | Disposition: A | Discharge status: Home / Self Care | Payer: Managed Care, Other (non HMO) | Attending: Emergency Medicine | Admitting: Emergency Medicine

## 2015-07-09 ENCOUNTER — Encounter (HOSPITAL_COMMUNITY): Payer: Self-pay

## 2015-07-09 DIAGNOSIS — M545 Low back pain: Secondary | ICD-10-CM | POA: Diagnosis not present

## 2015-07-09 DIAGNOSIS — Y999 Unspecified external cause status: Secondary | ICD-10-CM | POA: Insufficient documentation

## 2015-07-09 DIAGNOSIS — Y9289 Other specified places as the place of occurrence of the external cause: Secondary | ICD-10-CM | POA: Insufficient documentation

## 2015-07-09 DIAGNOSIS — S060X0A Concussion without loss of consciousness, initial encounter: Secondary | ICD-10-CM | POA: Diagnosis not present

## 2015-07-09 DIAGNOSIS — Y9352 Activity, horseback riding: Secondary | ICD-10-CM | POA: Insufficient documentation

## 2015-07-09 DIAGNOSIS — S0990XA Unspecified injury of head, initial encounter: Secondary | ICD-10-CM | POA: Diagnosis present

## 2015-07-09 DIAGNOSIS — M549 Dorsalgia, unspecified: Secondary | ICD-10-CM

## 2015-07-09 MED ORDER — IBUPROFEN 800 MG PO TABS: 800.0000 mg | ORAL_TABLET | Freq: Three times a day (TID) | ORAL | Status: DC

## 2015-07-09 MED ORDER — IBUPROFEN 800 MG PO TABS
800.0000 mg | ORAL_TABLET | Freq: Once | ORAL | Status: AC
  Administered 2015-07-09: 800 mg via ORAL
  Filled 2015-07-09: qty 1

## 2015-07-09 NOTE — ED Notes (Signed)
Denies neck pain. Reports lower back pain.

## 2015-07-09 NOTE — ED Provider Notes (Signed)
CSN: [id number]     Arrival date & time 07/09/15  1610 History   First MD Initiated Contact with Patient 07/09/15 1825     Chief Complaint  Patient presents with  . Head Injury     (Consider location/radiation/quality/duration/timing/severity/associated sxs/prior Treatment) HPI Comments: The patient is a 30 year old female who presents to the hospital approximately 2-1/2 hours after being thrown off of her horse striking her head in her lower back on the ground, she states that she did not lose consciousness but had acute onset of headache, diplopia and lightheadedness. This lasted approximately 30 minutes and then has resolved leaving her with a residual headache. She has no difficulty handling, no changes in her speech, no difficulty using her arms or legs. She has never had a concussion before. She is not on blood thinners. She has no neck pain. The symptoms are persistent, gradually improving  Patient is a 30 y.o. female presenting with head injury. The history is provided by the patient.  Head Injury   Past Medical History  Diagnosis Date  . Vaginal Pap smear, abnormal    Past Surgical History  Procedure Laterality Date  . Cyst excision Left     AXILLARY   Family History  Problem Relation Age of Onset  . Heart disease Father   . Hypertension Father   . Stroke Father   . Cancer Maternal Aunt   . Diabetes Maternal Grandmother   . Stroke Maternal Grandmother   . Heart disease Maternal Grandfather   . Stroke Maternal Grandfather   . Heart disease Paternal Grandfather    Social History  Substance Use Topics  . Smoking status: Never Smoker   . Smokeless tobacco: Never Used  . Alcohol Use: No   OB History    Gravida Para Term Preterm AB TAB SAB Ectopic Multiple Living   0 0 0 0 0 0 2     Review of Systems  All other systems reviewed and are negative.     Allergies  Bee venom; Flagyl; Keflex; Penicillins; Tessalon; and Tylox  Home Medications   Prior to  Admission medications   Medication Sig Start Date End Date Taking? Authorizing Provider  ibuprofen (ADVIL,MOTRIN) 800 MG tablet Take 1 tablet (800 mg total) by mouth 3 (three) times daily. 07/09/15   Eber Hong, MD  norgestimate-ethinyl estradiol (ORTHO-CYCLEN,SPRINTEC,PREVIFEM) 0.25-35 MG-MCG tablet Take 1 tablet by mouth daily. 12/07/14   Cheral Marker, CNM   BP 135/71 mmHg  Pulse 86  Temp(Src) 98.2 F (36.8 C) (Oral)  Resp 18  Ht  (1.549 m)  Wt 195 lb (88.451 kg)  BMI 36.86 kg/m2  SpO2 100%  LMP 06/11/2015 Physical Exam  Constitutional: She appears well-developed and well-nourished. No distress.  HENT:  Head: Normocephalic and atraumatic.  Mouth/Throat: Oropharynx is clear and moist. No oropharyngeal exudate.  No hematoma, contusion, depression, laceration, abrasion to the right side of the hand over the location of the injury, no signs of basilar skull fracture, no hemotympanum, no malocclusion, no raccoon eyes, no battle sign  Eyes: Conjunctivae and EOM are normal. Pupils are equal, round, and reactive to light. Right eye exhibits no discharge. Left eye exhibits no discharge. No scleral icterus.  Neck: Normal range of motion. Neck supple. No JVD present. No thyromegaly present.  Cardiovascular: Normal rate, regular rhythm, normal heart sounds and intact distal pulses.  Exam reveals no gallop and no friction rub.   No murmur heard. Pulmonary/Chest: Effort normal and breath sounds normal.  No respiratory distress. She has no wheezes. She has no rales.  Abdominal: Soft. Bowel sounds are normal. She exhibits no distension and no mass. There is no tenderness.  Musculoskeletal: Normal range of motion. She exhibits no edema or tenderness.  Lymphadenopathy:    She has no cervical adenopathy.  Neurological: She is alert. Coordination normal.  Normal gait, speech, strength, sensation of all 4 extremities, cranial nerves III through XII intact, normal finger nose finger, normal rapid  alternating movements  Skin: Skin is warm and dry. No rash noted. No erythema.  Psychiatric: She has a normal mood and affect. Her behavior is normal.  Nursing note and vitals reviewed.   ED Course  Procedures (including critical care time) Labs Review Labs Reviewed  PREGNANCY, URINE    Imaging Review Ct Head Wo Contrast  07/09/2015  CLINICAL DATA:  Fall off horse, head injury, headache, blurred vision, nausea EXAM: CT HEAD WITHOUT CONTRAST CT CERVICAL SPINE WITHOUT CONTRAST TECHNIQUE: Multidetector CT imaging of the head and cervical spine was performed following the standard protocol without intravenous contrast. Multiplanar CT image reconstructions of the cervical spine were also generated. COMPARISON:  None. FINDINGS: CT HEAD FINDINGS No evidence of parenchymal hemorrhage or extra-axial fluid collection. No mass lesion, mass effect, or midline shift. No CT evidence of acute infarction. Cerebral volume is within normal limits.  No ventriculomegaly. The visualized paranasal sinuses are essentially clear. The mastoid air cells are unopacified. No evidence of calvarial fracture. CT CERVICAL SPINE FINDINGS Reversal the normal cervical lordosis. No evidence of fracture or dislocation. Vertebral body heights and intervertebral disc spaces are maintained. Dens appears intact. No prevertebral soft tissue swelling. Visualized thyroid is unremarkable. Visualized lung apices are clear. IMPRESSION: Normal head CT. Normal cervical spine CT. Electronically Signed   By: Charline Bills M.D.   On: 07/09/2015 17:18   Ct Cervical Spine Wo Contrast  07/09/2015  CLINICAL DATA:  Fall off horse, head injury, headache, blurred vision, nausea EXAM: CT HEAD WITHOUT CONTRAST CT CERVICAL SPINE WITHOUT CONTRAST TECHNIQUE: Multidetector CT imaging of the head and cervical spine was performed following the standard protocol without intravenous contrast. Multiplanar CT image reconstructions of the cervical spine were also  generated. COMPARISON:  None. FINDINGS: CT HEAD FINDINGS No evidence of parenchymal hemorrhage or extra-axial fluid collection. No mass lesion, mass effect, or midline shift. No CT evidence of acute infarction. Cerebral volume is within normal limits.  No ventriculomegaly. The visualized paranasal sinuses are essentially clear. The mastoid air cells are unopacified. No evidence of calvarial fracture. CT CERVICAL SPINE FINDINGS Reversal the normal cervical lordosis. No evidence of fracture or dislocation. Vertebral body heights and intervertebral disc spaces are maintained. Dens appears intact. No prevertebral soft tissue swelling. Visualized thyroid is unremarkable. Visualized lung apices are clear. IMPRESSION: Normal head CT. Normal cervical spine CT. Electronically Signed   By: Charline Bills M.D.   On: 07/09/2015 17:18   I have personally reviewed and evaluated these images and lab results as part of my medical decision-making.    MDM   Final diagnoses:  Concussion, without loss of consciousness, initial encounter  Back pain, unspecified location    The patient has a normal neurologic exam, her level of alertness is normal, she has no confusion, answers all my questions about orientation appropriately quickly and with clear speech. She has no lack of attention, I discussed with the patient and her family members the indications for return as well as the indications for follow-up and the ongoing side  effects of a concussion that she should expect. She has expressed her understanding to the verbal instructions as have the family members, they appear stable for discharge. Ibuprofen offered and accepted  CT negative for acute fractures / bleeds / spine injury , Meds given in ED:  Medications  ibuprofen (ADVIL,MOTRIN) tablet 800 mg (not administered)    New Prescriptions   IBUPROFEN (ADVIL,MOTRIN) 800 MG TABLET    Take 1 tablet (800 mg total) by mouth 3 (three) times daily.         Eber HongBrian Citlali Gautney, MD 07/09/15 [phone]

## 2015-07-09 NOTE — Discharge Instructions (Signed)

## 2015-07-09 NOTE — ED Notes (Signed)
Reports of falling off horse and hitting head PTA. Denies LOC but does state "it took me a while to remember what happened." Patient reports headache, blurred vision and nausesa. Denies vomiting. Patient alert and oriented x4. NAD noted.

## 2015-12-13 ENCOUNTER — Encounter: Payer: Self-pay | Admitting: Women's Health

## 2016-04-19 ENCOUNTER — Ambulatory Visit: Payer: Managed Care, Other (non HMO) | Admitting: Women's Health

## 2016-04-26 ENCOUNTER — Ambulatory Visit: Payer: PRIVATE HEALTH INSURANCE | Admitting: Women's Health

## 2016-05-03 ENCOUNTER — Ambulatory Visit: Payer: PRIVATE HEALTH INSURANCE | Admitting: Women's Health

## 2016-05-15 ENCOUNTER — Encounter: Payer: Self-pay | Admitting: Women's Health

## 2016-05-15 ENCOUNTER — Ambulatory Visit (INDEPENDENT_AMBULATORY_CARE_PROVIDER_SITE_OTHER): Payer: PRIVATE HEALTH INSURANCE | Admitting: Women's Health

## 2016-05-15 VITALS — BP 123/73 | HR 78 | Ht 61.25 in | Wt 196.0 lb

## 2016-05-15 DIAGNOSIS — Z8742 Personal history of other diseases of the female genital tract: Secondary | ICD-10-CM

## 2016-05-15 DIAGNOSIS — Z3009 Encounter for other general counseling and advice on contraception: Secondary | ICD-10-CM | POA: Diagnosis not present

## 2016-05-15 DIAGNOSIS — N926 Irregular menstruation, unspecified: Secondary | ICD-10-CM

## 2016-05-15 NOTE — Progress Notes (Signed)
   [facility] Visit  Patient name: Kathleen DodgeHeather F Chan MRN [id number]  Date of birth: 07/21/1985  CC & HPI:  Kathleen Chan is a 31 y.o. 572P2002 Caucasian female presenting today to discuss getting IUD. Is starting nursing school in Fall, is in a new relationship and he wants a baby now. He is Catholic and doesn't believe in contraception. She wants something that is very effective that he won't feel. Discussed it is possible he could feel IUD strings. Also discussed nexplanon, but that he may be able to see and also feel it. Prefers IUD. Has h/o abnormal paps, last 12/2014, supposed to be getting yearly. Discussed we need to get pap prior to IUD insertion. Last sex 2 days ago w/o contraception. Periods irregular d/t PCOS. Has been on COCs but rx ran out, so she hasn't been taking since Aug-Sept.  No LMP recorded. Patient is not currently having periods (Reason: Irregular Periods). The current method of family planning is none. Last pap Aug 2016, abnormal  Pertinent History Reviewed:  Medical & Surgical Hx:   Past medical, surgical, family, and social history reviewed in electronic medical record Medications: Reviewed & Updated - see associated section Allergies: Reviewed in electronic medical record  Objective Findings:  Vitals: BP 123/73 (BP Location: Right Arm, Patient Position: Sitting, Cuff Size: Normal)   Pulse 78   Ht 5' 1.25" (1.556 m)   Wt 196 lb (88.9 kg)   BMI 36.73 kg/m  Body mass index is 36.73 kg/m.  Physical Examination: General appearance - alert, well appearing, and in no distress  No results found for this or any previous visit (from the past 24 hour(s)).   Assessment & Plan:  A:   Contraception counseling  Abnormal paps, past due  P:  Return in about 1 week (around 05/22/2016) for pap smear & physical w/ me.   As long as pap normal will schedule IUD insertion, needs to be 10d from last sex w/ bhcg am/insertion pm, 14d from last sex w/ UPT, or on  period  Advised abstinence until IUD insertion  Kathleen DuncansBooker, Kathleen Chan CNM, [facility]NP[facility] 05/15/2016 3:03 PM

## 2016-05-29 ENCOUNTER — Ambulatory Visit (INDEPENDENT_AMBULATORY_CARE_PROVIDER_SITE_OTHER): Payer: PRIVATE HEALTH INSURANCE | Admitting: Women's Health

## 2016-05-29 ENCOUNTER — Encounter: Payer: Self-pay | Admitting: Women's Health

## 2016-05-29 ENCOUNTER — Other Ambulatory Visit (HOSPITAL_COMMUNITY)
Admission: RE | Admit: 2016-05-29 | Discharge: 2016-05-29 | Disposition: A | Discharge status: Home / Self Care | Payer: PRIVATE HEALTH INSURANCE | Source: Ambulatory Visit | Attending: Obstetrics & Gynecology | Admitting: Obstetrics & Gynecology

## 2016-05-29 VITALS — BP 126/64 | HR 75 | Ht 61.5 in | Wt 191.0 lb

## 2016-05-29 DIAGNOSIS — Z113 Encounter for screening for infections with a predominantly sexual mode of transmission: Secondary | ICD-10-CM

## 2016-05-29 DIAGNOSIS — Z3202 Encounter for pregnancy test, result negative: Secondary | ICD-10-CM | POA: Diagnosis not present

## 2016-05-29 DIAGNOSIS — Z01419 Encounter for gynecological examination (general) (routine) without abnormal findings: Secondary | ICD-10-CM

## 2016-05-29 DIAGNOSIS — Z1151 Encounter for screening for human papillomavirus (HPV): Secondary | ICD-10-CM | POA: Insufficient documentation

## 2016-05-29 LAB — POCT URINE PREGNANCY: PREG TEST UR: NEGATIVE

## 2016-05-29 NOTE — Progress Notes (Signed)
Subjective:   Kathleen Chan is a 31 y.o. 312P2002 Caucasian female here for a routine well-woman exam.  Patient's last menstrual period was 03/28/2016.    Current complaints: irregular periods, was on coc's until few months ago when ran out- periods were regular on coc's. Has new boyfriend who wants to have a baby, he is catholic and doesn't want her to have birth control. She is starting nursing school in the fall and doesn't want to have a baby until after she gets out of school, she wants to get an IUD. Last sex last night, 05/29/15 I initially saw her in Aug 2016 for reports of heavy irregular periods w/ hisutism and obesity, placed her on coc's, and ordered pelvic u/s to evaluate for PCO, however she was never able to come for u/s d/t insurance H/O abnormal pap 2016 w/ CIN-I colpo with benign endocervical and endometrial biopsy, was recommended to repeat pap at 1 & 7977yrs, however she did not f/u. She wants to proceed w/ pelvic u/s at this time PCP: [facility] for sick visits       Does desire labs including STD screening  Social History: Sexual: heterosexual Marital Status: dating  The following portions of the patient's history were reviewed and updated as appropriate: allergies, current medications, past family history, past medical history, past social history, past surgical history and problem list.  Past Medical History Past Medical History:  Diagnosis Date  . Vaginal Pap smear, abnormal     Past Surgical History Past Surgical History:  Procedure Laterality Date  . CYST EXCISION Left    AXILLARY    Gynecologic History [id number]  Patient's last menstrual period was 03/28/2016. Contraception: none Last Pap: 2016. Results were: atypical glandular cells, colpo CIN-1 Last mammogram: never. Results were: n/a Last TCS: never  Obstetric History OB History  Gravida Para Term Preterm AB Living  2 2 2  0 0 2  SAB TAB Ectopic Multiple Live Births  0 0 0 0 2    # Outcome  Date GA Lbr Len/2nd Weight Sex Delivery Anes PTL Lv  2 Term 2008 [id number]  7 lb 10 oz (3.459 kg) F Vag-Spont EPI  LIV  1 Term 2006 3811w0d  6 lb (2.722 kg) F Vag-Spont EPI  LIV      Current Medications Current Outpatient Prescriptions on File Prior to Visit  Medication Sig Dispense Refill  . ibuprofen (ADVIL,MOTRIN) 800 MG tablet Take 1 tablet (800 mg total) by mouth 3 (three) times daily. (Patient not taking: Reported on 05/15/2016) 21 tablet 0  . norgestimate-ethinyl estradiol (ORTHO-CYCLEN,SPRINTEC,PREVIFEM) 0.25-35 MG-MCG tablet Take 1 tablet by mouth daily. (Patient not taking: Reported on 05/15/2016) 1 Package 11   Current Facility-Administered Medications on File Prior to Visit  Medication Dose Route Frequency Provider Last Rate Last Dose  . cefTRIAXone (ROCEPHIN) injection 1 g  1 g Intramuscular Q24H Deatra CanterWilliam J Oxford, FNP   1 g at 06/10/14 [id number]    Review of Systems Patient denies any headaches, blurred vision, shortness of breath, chest pain, abdominal pain, problems with bowel movements, urination, or intercourse.  Objective:  BP 126/64 (BP Location: Right Arm, Patient Position: Sitting, Cuff Size: Normal)   Pulse 75   Ht 5' 1.5" (1.562 m)   Wt 191 lb (86.6 kg)   LMP 03/28/2016   BMI 35.50 kg/m  Physical Exam  General:  Well developed, well nourished, no acute distress. She is alert and oriented x3. Skin:  Warm and dry, +hirsutism Neck:  Midline trachea,  no thyromegaly or nodules Cardiovascular: Regular rate and rhythm, no murmur heard Lungs:  Effort normal, all lung fields clear to auscultation bilaterally Breasts:  No dominant palpable mass, retraction, or nipple discharge Abdomen:  Soft, non tender, no hepatosplenomegaly or masses Pelvic:  External genitalia is normal in appearance.  The vagina is normal in appearance. The cervix is bulbous, no CMT.  Thin prep pap is done w/ HR HPV cotesting. Uterus is felt to be normal size, shape, and contour.  No adnexal masses or  tenderness noted. Extremities:  No swelling or varicosities noted Psych:  She has a normal mood and affect  Assessment:   Healthy well-woman exam Obesity Irregular periods Hirsutism Likely PCOS H/O abnormal pap/colpo Wants IUD STD screen  Plan:  GC/CT from pap, CBC, CMP, TSH, A1C, HIV, RPR, HEP B today Abstinence until IUD insertion F/U 2/7 for IUD insertion, unless colpo needed if pap abnormal Discussed we could do pelvic u/s to check for PCO at 4wk IUD check Mammogram @30yo  or sooner if problems Colonoscopy @30yo  or sooner if problems  Marge Duncans CNM, [facility] 05/29/2016 12:19 PM

## 2016-05-29 NOTE — Patient Instructions (Signed)
NO SEX UNTIL AFTER YOU GET YOUR IUD

## 2016-05-30 LAB — COMPREHENSIVE METABOLIC PANEL
A/G RATIO: 1.6 (ref 1.2–2.2)
ALBUMIN: 4.4 g/dL (ref 3.5–5.5)
ALT: 22 IU/L (ref 0–32)
AST: 17 IU/L (ref 0–40)
Alkaline Phosphatase: 117 IU/L (ref 39–117)
BUN / CREAT RATIO: 12 (ref 9–23)
BUN: 9 mg/dL (ref 6–20)
Bilirubin Total: 0.3 mg/dL (ref 0.0–1.2)
CALCIUM: 9.6 mg/dL (ref 8.7–10.2)
CO2: 27 mmol/L (ref 18–29)
Chloride: 101 mmol/L (ref 96–106)
Creatinine, Ser: 0.77 mg/dL (ref 0.57–1.00)
GFR calc Af Amer: 121 mL/min/{1.73_m2} (ref >59)
GFR, EST NON AFRICAN AMERICAN: 105 mL/min/{1.73_m2} (ref >59)
GLOBULIN, TOTAL: 2.7 g/dL (ref 1.5–4.5)
Glucose: 74 mg/dL (ref 65–99)
POTASSIUM: 4.2 mmol/L (ref 3.5–5.2)
SODIUM: 143 mmol/L (ref 134–144)
Total Protein: 7.1 g/dL (ref 6.0–8.5)

## 2016-05-30 LAB — CBC
HEMATOCRIT: 40.9 % (ref 34.0–46.6)
HEMOGLOBIN: 12.7 g/dL (ref 11.1–15.9)
MCH: 26.9 pg (ref 26.6–33.0)
MCHC: 31.1 g/dL — AB (ref 31.5–35.7)
MCV: 87 fL (ref 79–97)
Platelets: 255 10*3/uL (ref 150–379)
RBC: 4.72 x10E6/uL (ref 3.77–5.28)
RDW: 14.5 % (ref 12.3–15.4)
WBC: 7.4 10*3/uL (ref 3.4–10.8)

## 2016-05-30 LAB — HIV ANTIBODY (ROUTINE TESTING W REFLEX): HIV SCREEN 4TH GENERATION: NONREACTIVE

## 2016-05-30 LAB — CYTOLOGY - PAP
CHLAMYDIA, DNA PROBE: NEGATIVE
Diagnosis: NEGATIVE
HPV (WINDOPATH): NOT DETECTED
Neisseria Gonorrhea: NEGATIVE

## 2016-05-30 LAB — RPR: RPR: NONREACTIVE

## 2016-05-30 LAB — HEPATITIS B SURFACE ANTIGEN: Hepatitis B Surface Ag: NEGATIVE

## 2016-05-30 LAB — HEMOGLOBIN A1C
Est. average glucose Bld gHb Est-mCnc: 105 mg/dL
HEMOGLOBIN A1C: 5.3 % (ref 4.8–5.6)

## 2016-05-30 LAB — TSH: TSH: 2.93 u[IU]/mL (ref 0.450–4.500)

## 2016-06-13 ENCOUNTER — Other Ambulatory Visit: Payer: PRIVATE HEALTH INSURANCE

## 2016-06-13 ENCOUNTER — Ambulatory Visit: Payer: PRIVATE HEALTH INSURANCE | Admitting: Women's Health

## 2016-07-01 ENCOUNTER — Ambulatory Visit (INDEPENDENT_AMBULATORY_CARE_PROVIDER_SITE_OTHER): Payer: PRIVATE HEALTH INSURANCE | Admitting: Women's Health

## 2016-07-01 ENCOUNTER — Encounter: Payer: Self-pay | Admitting: Women's Health

## 2016-07-01 ENCOUNTER — Other Ambulatory Visit: Payer: PRIVATE HEALTH INSURANCE

## 2016-07-01 VITALS — BP 120/72 | HR 98 | Wt 201.0 lb

## 2016-07-01 DIAGNOSIS — Z30430 Encounter for insertion of intrauterine contraceptive device: Secondary | ICD-10-CM

## 2016-07-01 DIAGNOSIS — Z30014 Encounter for initial prescription of intrauterine contraceptive device: Secondary | ICD-10-CM | POA: Diagnosis not present

## 2016-07-01 DIAGNOSIS — N926 Irregular menstruation, unspecified: Secondary | ICD-10-CM

## 2016-07-01 DIAGNOSIS — L680 Hirsutism: Secondary | ICD-10-CM

## 2016-07-01 LAB — BETA HCG QUANT (REF LAB)

## 2016-07-01 NOTE — Progress Notes (Signed)
Kathleen DodgeHeather F Chan is a 31 y.o. year old 442P2002 Caucasian female who presents for placement of a Mirena IUD.  No LMP recorded. Patient is not currently having periods (Reason: Irregular Periods). BP 120/72 (BP Location: Right Arm, Patient Position: Sitting, Cuff Size: Normal)   Pulse 98   Wt 201 lb (91.2 kg)   BMI 37.36 kg/m  Last sexual intercourse was >10d ago, and [facility] pregnancy test today was neg  The risks and benefits of the method and placement have been thouroughly reviewed with the patient and all questions were answered.  Specifically the patient is aware of failure rate of 05/998, expulsion of the IUD and of possible perforation.  The patient is aware of irregular bleeding due to the method and understands the incidence of irregular bleeding diminishes with time.  Signed copy of informed consent in chart.   Time out was performed.  A graves speculum was placed in the vagina.  The cervix was visualized, prepped using Betadine, and grasped with a single tooth tenaculum. The uterus was found to be neutral and it sounded to 8 cm.  MIrena IUD placed per manufacturer's recommendations.   The strings were trimmed to 3 cm.  Sonogram was performed and the proper placement of the IUD was verified via transvaginal u/s.   The patient was given post procedure instructions, including signs and symptoms of infection and to check for the strings after each menses or each month, and refraining from intercourse or anything in the vagina for 3 days.  She was given a Mirena care card with date IUD placed, and date IUD to be removed.  She is scheduled for a f/u appointment in 4 weeks for IUD f/u and pelvic u/s to assess for pcos per pt request d/t irregular periods, hirsutism.  Marge DuncansBooker, Sears Oran Randall CNM, [facility]NP-BC 07/01/2016 4:59 PM

## 2016-07-01 NOTE — Patient Instructions (Signed)
 Nothing in vagina for 3 days (no sex, douching, tampons, etc...)  Check your strings once a month to make sure you can feel them, if you are not able to please let us know  If you develop a fever of 100.4 or more in the next few weeks, or if you develop severe abdominal pain, please let us know  Use a backup method of birth control, such as condoms, for 2 weeks   Levonorgestrel intrauterine device (IUD) What is this medicine? LEVONORGESTREL IUD (LEE voe nor jes trel) is a contraceptive (birth control) device. The device is placed inside the uterus by a healthcare professional. It is used to prevent pregnancy. This device can also be used to treat heavy bleeding that occurs during your period. This medicine may be used for other purposes; ask your health care provider or pharmacist if you have questions. COMMON BRAND NAME(S): Kyleena, LILETTA, Mirena, Skyla What should I tell my health care provider before I take this medicine? They need to know if you have any of these conditions: -abnormal Pap smear -cancer of the breast, uterus, or cervix -diabetes -endometritis -genital or pelvic infection now or in the past -have more than one sexual partner or your partner has more than one partner -heart disease -history of an ectopic or tubal pregnancy -immune system problems -IUD in place -liver disease or tumor -problems with blood clots or take blood-thinners -seizures -use intravenous drugs -uterus of unusual shape -vaginal bleeding that has not been explained -an unusual or allergic reaction to levonorgestrel, other hormones, silicone, or polyethylene, medicines, foods, dyes, or preservatives -pregnant or trying to get pregnant -breast-feeding How should I use this medicine? This device is placed inside the uterus by a health care professional. Talk to your pediatrician regarding the use of this medicine in children. Special care may be needed. Overdosage: If you think you have  taken too much of this medicine contact a poison control center or emergency room at once. NOTE: This medicine is only for you. Do not share this medicine with others. What if I miss a dose? This does not apply. Depending on the brand of device you have inserted, the device will need to be replaced every 3 to 5 years if you wish to continue using this type of birth control. What may interact with this medicine? Do not take this medicine with any of the following medications: -amprenavir -bosentan -fosamprenavir This medicine may also interact with the following medications: -aprepitant -barbiturate medicines for inducing sleep or treating seizures -bexarotene -griseofulvin -medicines to treat seizures like carbamazepine, ethotoin, felbamate, oxcarbazepine, phenytoin, topiramate -modafinil -pioglitazone -rifabutin -rifampin -rifapentine -some medicines to treat HIV infection like atazanavir, indinavir, lopinavir, nelfinavir, tipranavir, ritonavir -St. John's wort -warfarin This list may not describe all possible interactions. Give your health care provider a list of all the medicines, herbs, non-prescription drugs, or dietary supplements you use. Also tell them if you smoke, drink alcohol, or use illegal drugs. Some items may interact with your medicine. What should I watch for while using this medicine? Visit your doctor or health care professional for regular check ups. See your doctor if you or your partner has sexual contact with others, becomes HIV positive, or gets a sexual transmitted disease. This product does not protect you against HIV infection (AIDS) or other sexually transmitted diseases. You can check the placement of the IUD yourself by reaching up to the top of your vagina with clean fingers to feel the threads. Do not pull on   the threads. It is a good habit to check placement after each menstrual period. Call your doctor right away if you feel more of the IUD than just the  threads or if you cannot feel the threads at all. The IUD may come out by itself. You may become pregnant if the device comes out. If you notice that the IUD has come out use a backup birth control method like condoms and call your health care provider. Using tampons will not change the position of the IUD and are okay to use during your period. This IUD can be safely scanned with magnetic resonance imaging (MRI) only under specific conditions. Before you have an MRI, tell your healthcare provider that you have an IUD in place, and which type of IUD you have in place. What side effects may I notice from receiving this medicine? Side effects that you should report to your doctor or health care professional as soon as possible: -allergic reactions like skin rash, itching or hives, swelling of the face, lips, or tongue -fever, flu-like symptoms -genital sores -high blood pressure -no menstrual period for 6 weeks during use -pain, swelling, warmth in the leg -pelvic pain or tenderness -severe or sudden headache -signs of pregnancy -stomach cramping -sudden shortness of breath -trouble with balance, talking, or walking -unusual vaginal bleeding, discharge -yellowing of the eyes or skin Side effects that usually do not require medical attention (report to your doctor or health care professional if they continue or are bothersome): -acne -breast pain -change in sex drive or performance -changes in weight -cramping, dizziness, or faintness while the device is being inserted -headache -irregular menstrual bleeding within first 3 to 6 months of use -nausea This list may not describe all possible side effects. Call your doctor for medical advice about side effects. You may report side effects to FDA at 1-800-FDA-1088. Where should I keep my medicine? This does not apply. NOTE: This sheet is a summary. It may not cover all possible information. If you have questions about this medicine, talk to  your doctor, pharmacist, or health care provider.  2017 Elsevier/Gold Standard (2015-10-13 13:46:37)  

## 2016-07-26 ENCOUNTER — Other Ambulatory Visit: Payer: Self-pay | Admitting: Women's Health

## 2016-07-26 DIAGNOSIS — L680 Hirsutism: Secondary | ICD-10-CM

## 2016-07-26 DIAGNOSIS — N926 Irregular menstruation, unspecified: Secondary | ICD-10-CM

## 2016-07-26 DIAGNOSIS — Z30430 Encounter for insertion of intrauterine contraceptive device: Secondary | ICD-10-CM

## 2016-07-29 ENCOUNTER — Ambulatory Visit (INDEPENDENT_AMBULATORY_CARE_PROVIDER_SITE_OTHER): Payer: PRIVATE HEALTH INSURANCE

## 2016-07-29 DIAGNOSIS — Z30430 Encounter for insertion of intrauterine contraceptive device: Secondary | ICD-10-CM

## 2016-07-29 DIAGNOSIS — N926 Irregular menstruation, unspecified: Secondary | ICD-10-CM | POA: Diagnosis not present

## 2016-07-29 DIAGNOSIS — L680 Hirsutism: Secondary | ICD-10-CM

## 2016-07-29 NOTE — Progress Notes (Signed)
PELVIC US TA/TV: homogeneous anteverted uterus,wnl,IUD is centrally located w/in the endometrium,EEC 4.4 mm,no free fluid,ovaries appear mobile,ovaries have a peripheral distribution of follicles

## 2016-08-01 ENCOUNTER — Encounter: Payer: Self-pay | Admitting: Women's Health

## 2016-08-01 DIAGNOSIS — E282 Polycystic ovarian syndrome: Secondary | ICD-10-CM | POA: Insufficient documentation

## 2016-08-02 ENCOUNTER — Encounter: Payer: Self-pay | Admitting: Women's Health

## 2016-10-06 ENCOUNTER — Encounter: Payer: Self-pay | Admitting: Women's Health

## 2016-10-09 ENCOUNTER — Other Ambulatory Visit: Payer: Self-pay | Admitting: Advanced Practice Midwife

## 2016-10-09 MED ORDER — NORGESTIMATE-ETH ESTRADIOL 0.25-35 MG-MCG PO TABS: 1.0000 | ORAL_TABLET | Freq: Every day | ORAL | 11 refills | Status: DC

## 2016-10-09 NOTE — Progress Notes (Signed)
BTB on IUD, doesn's want megace, wants to go back to [facility] May start now. Rx sent

## 2016-11-15 ENCOUNTER — Encounter: Payer: PRIVATE HEALTH INSURANCE | Admitting: Physician Assistant

## 2016-11-18 ENCOUNTER — Encounter: Payer: Self-pay | Admitting: Family Medicine

## 2016-11-20 ENCOUNTER — Encounter: Payer: Commercial Managed Care - PPO | Admitting: Family Medicine

## 2016-12-04 IMAGING — CT CT CERVICAL SPINE W/O CM
3 of 5 series · 12 of 33 positions shown, 14 images · non-contrast
Comparison: None.

CLINICAL DATA: Fall off horse, head injury, headache, blurred
vision, nausea

EXAM:
CT HEAD WITHOUT CONTRAST
CT CERVICAL SPINE WITHOUT CONTRAST
TECHNIQUE: Multidetector CT imaging of the head and cervical spine was
performed following the standard protocol without intravenous
contrast. Multiplanar CT image reconstructions of the cervical spine
were also generated.

[Series 5: cervical st 2.0 b31s · axial · 0.27mm/px · z∈[+84,+176]mm · 4 of 78 slices shown, 5 images]
[im 16/78  soft-tissue]
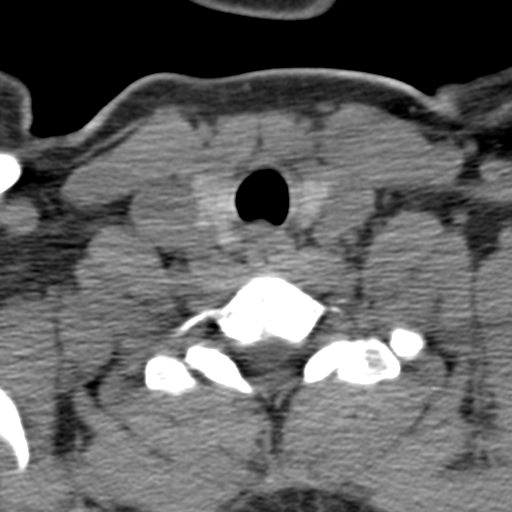
[im 16/78  bone]
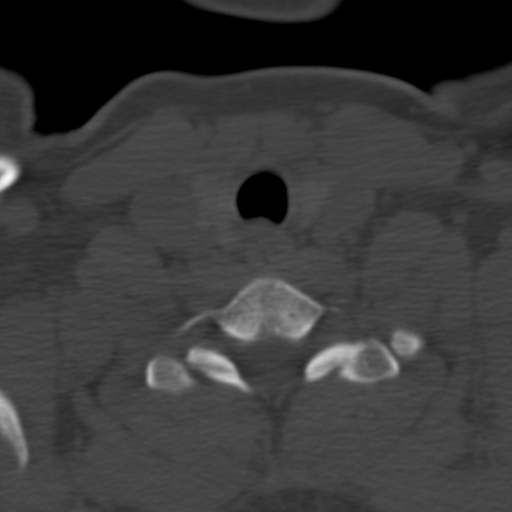
[im 31/78  bone]
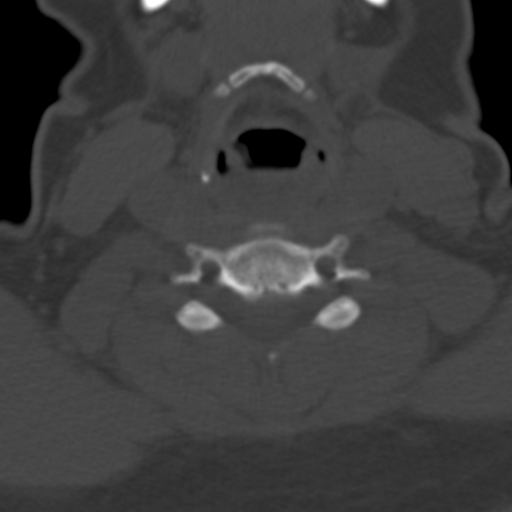
[im 47/78  bone]
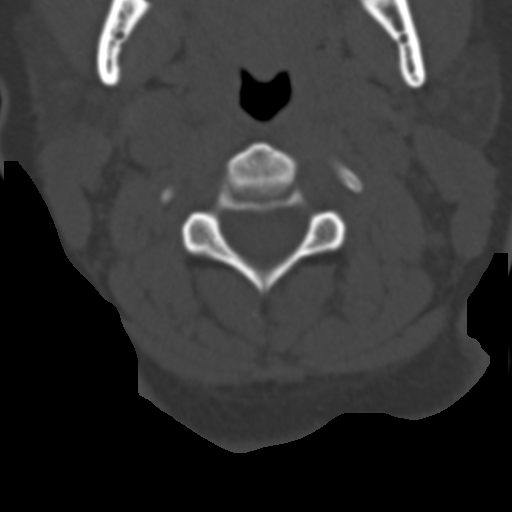
[im 62/78  bone]
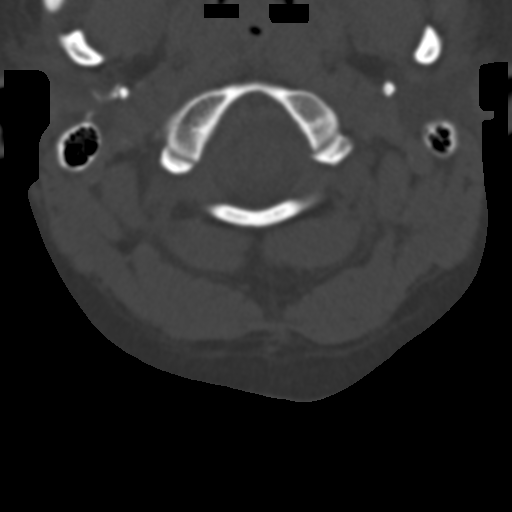

[Series 7: sagittal bone 2.0 · sagittal · 0.22mm/px · 5 of 42 slices shown, 6 images]
[im 14/42  bone]
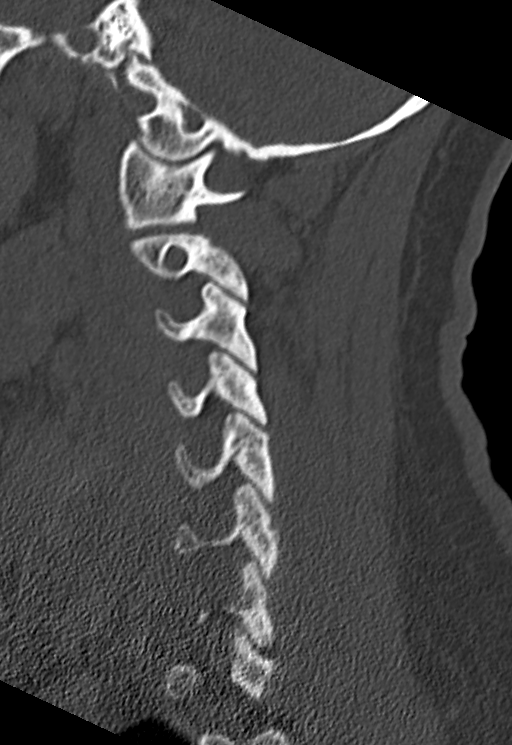
[im 18/42  bone]
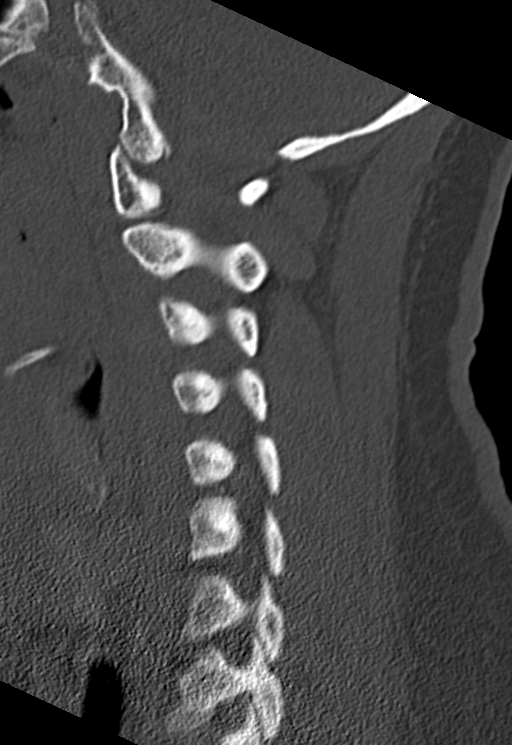
[im 21/42  soft-tissue]
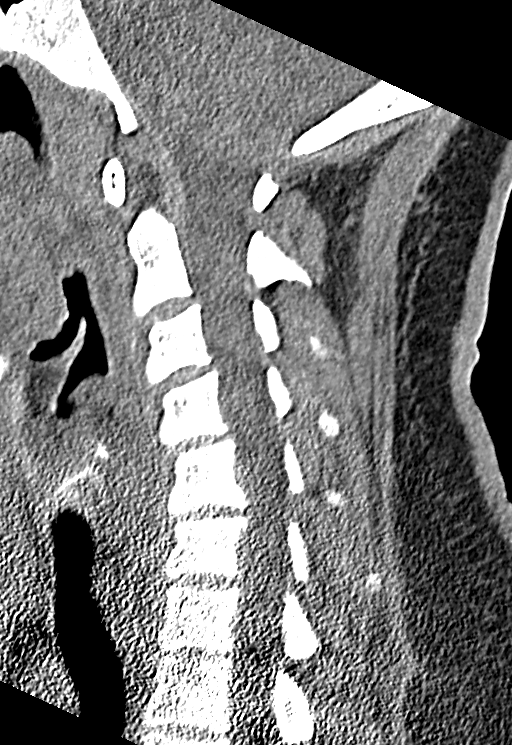
[im 21/42  bone]
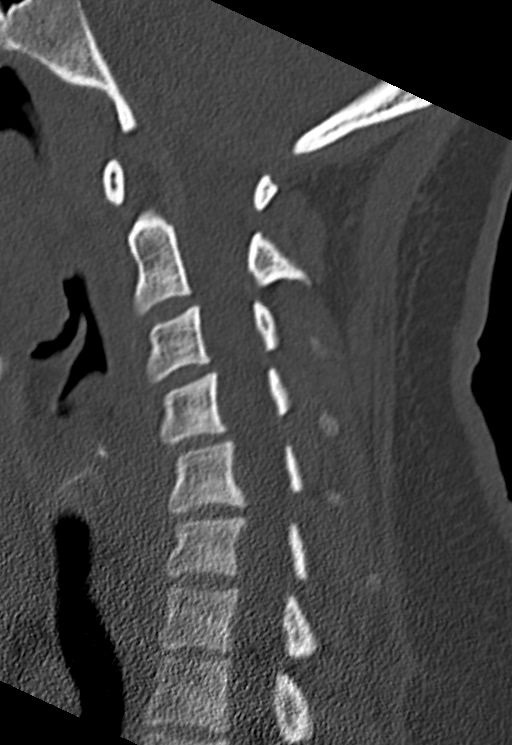
[im 24/42  bone]
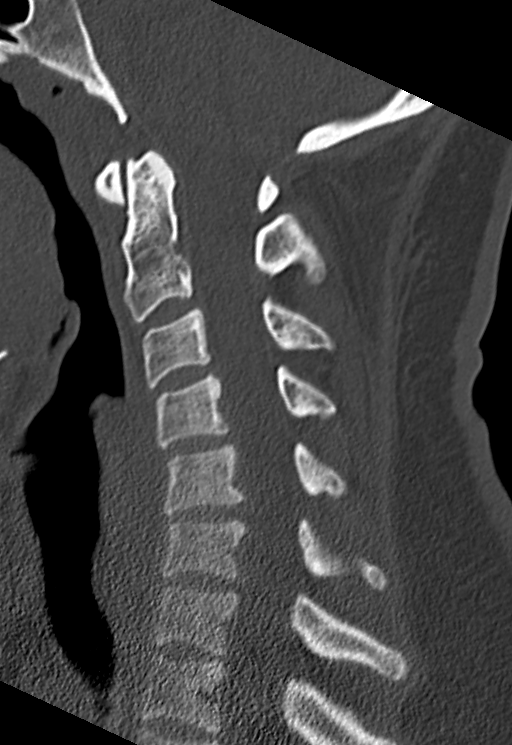
[im 28/42  bone]
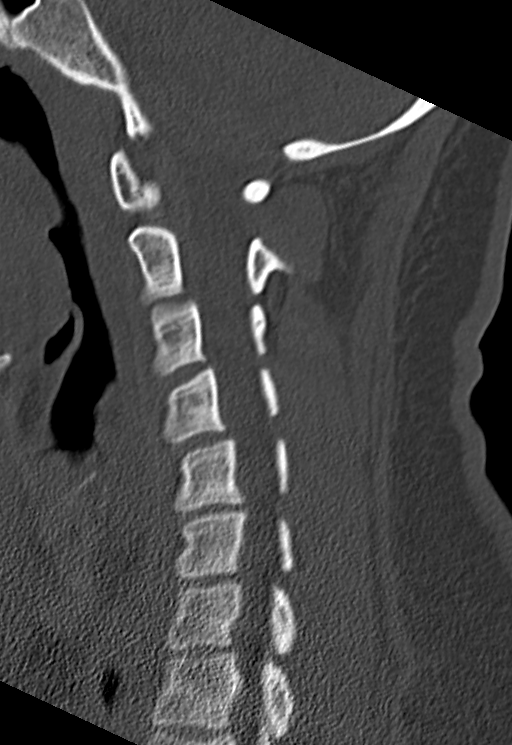

[Series 8: coronal bone 2.0 · coronal · 0.15mm/px · 3 of 52 slices shown]
[im 11/52  bone]
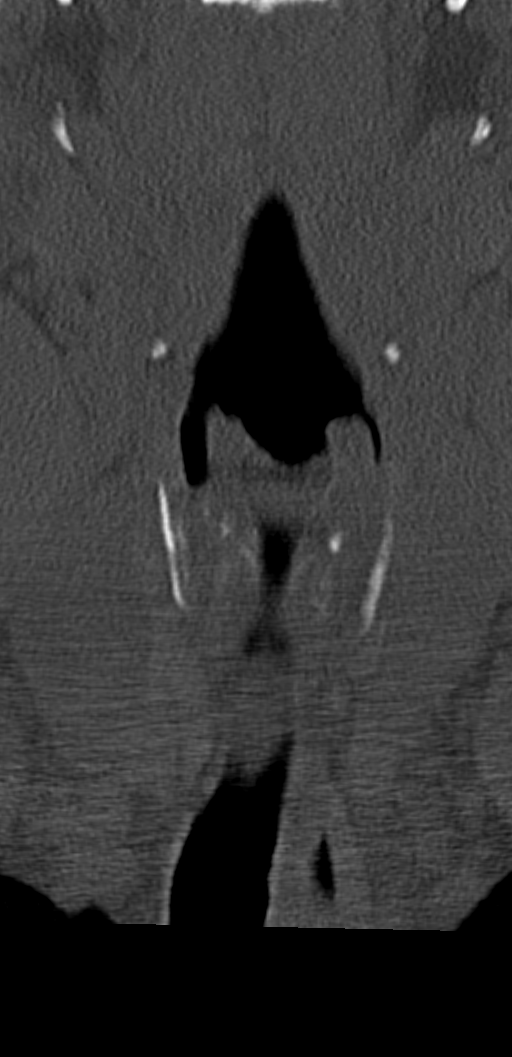
[im 21/52  bone]
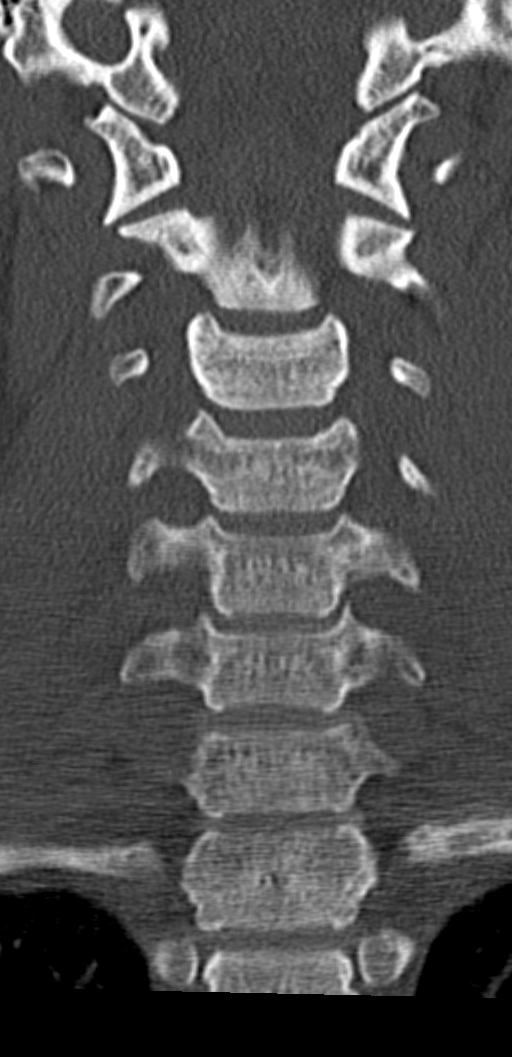
[im 31/52  bone]
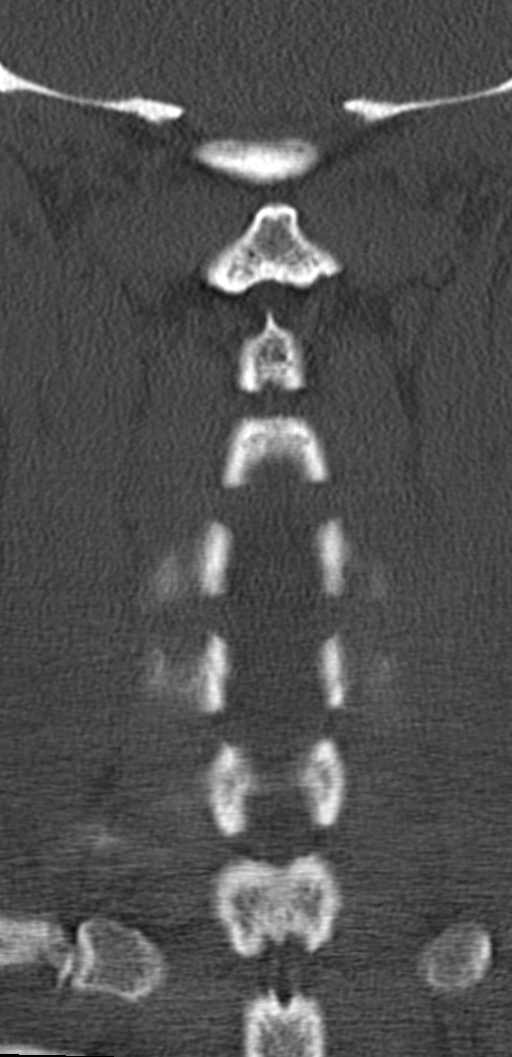

[12 of 33 positions shown; findings below may reference images not displayed]

FINDINGS: CT HEAD FINDINGS

No evidence of parenchymal hemorrhage or extra-axial fluid
collection. No mass lesion, mass effect, or midline shift.

No CT evidence of acute infarction.

Cerebral volume is within normal limits.  No ventriculomegaly.

The visualized paranasal sinuses are essentially clear. The mastoid
air cells are unopacified.

No evidence of calvarial fracture.

CT CERVICAL SPINE FINDINGS

Reversal the normal cervical lordosis.

No evidence of fracture or dislocation. Vertebral body heights and
intervertebral disc spaces are maintained. Dens appears intact.

No prevertebral soft tissue swelling.

Visualized thyroid is unremarkable.

Visualized lung apices are clear.
IMPRESSION: Normal head CT.

Normal cervical spine CT.

## 2018-08-28 ENCOUNTER — Telehealth: Payer: Self-pay | Admitting: Obstetrics & Gynecology

## 2018-08-28 NOTE — Telephone Encounter (Signed)
Returned patients call, no answer and vm full.

## 2018-08-28 NOTE — Telephone Encounter (Signed)
Have her see whoever put it in nothing urgent

## 2018-08-28 NOTE — Telephone Encounter (Signed)
Patient called, stated that she has an IUD and is having pelvic pain x 3 weeks.    [phone]

## 2018-08-28 NOTE — Telephone Encounter (Signed)
Hope to make appointment for patient to see Selena Batten.

## 2018-08-28 NOTE — Telephone Encounter (Signed)
Pt has bilateral hip pain that radiates to pubic symphisis.  Tried ibuprofen and tylenol with no relief.  Pain has been going on for a few weeks now. No bleeding now. Pt does feel strings. She wanted to see if she needs an appointment? She is not getting any relief with her home remedies.

## 2018-09-02 ENCOUNTER — Encounter: Payer: Self-pay | Admitting: *Deleted

## 2018-09-03 ENCOUNTER — Encounter: Payer: Self-pay | Admitting: Women's Health

## 2018-09-03 ENCOUNTER — Ambulatory Visit: Payer: Self-pay | Admitting: Women's Health

## 2018-09-03 ENCOUNTER — Other Ambulatory Visit: Payer: Self-pay

## 2018-09-03 VITALS — BP 129/80 | HR 94 | Ht 61.0 in | Wt 200.8 lb

## 2018-09-03 DIAGNOSIS — Z30432 Encounter for removal of intrauterine contraceptive device: Secondary | ICD-10-CM

## 2018-09-03 DIAGNOSIS — N898 Other specified noninflammatory disorders of vagina: Secondary | ICD-10-CM

## 2018-09-03 DIAGNOSIS — R102 Pelvic and perineal pain: Secondary | ICD-10-CM

## 2018-09-03 DIAGNOSIS — B9689 Other specified bacterial agents as the cause of diseases classified elsewhere: Secondary | ICD-10-CM

## 2018-09-03 DIAGNOSIS — N760 Acute vaginitis: Secondary | ICD-10-CM

## 2018-09-03 LAB — POCT WET PREP (WET MOUNT)
Clue Cells Wet Prep Whiff POC: POSITIVE
Trichomonas Wet Prep HPF POC: ABSENT

## 2018-09-03 MED ORDER — METRONIDAZOLE 500 MG PO TABS: 500.0000 mg | ORAL_TABLET | Freq: Two times a day (BID) | ORAL | 0 refills | Status: DC

## 2018-09-03 NOTE — Progress Notes (Signed)
   IUD REMOVAL  Patient name: Kathleen Chan MRN [id number]  Date of birth: 12-03-1985 Subjective Findings:   Kathleen Chan is a 33 y.o. G95P2002 Caucasian female being seen today for removal of a Mirena IUD. Her IUD was placed 07/01/16.  She desires removal because of pelvic pain x 3 weeks, concerned it's from IUD.   Has had heavy bleeding since insertion. Wants it out. Wants gc/ct testing.  Signed copy of informed consent in chart.   No LMP recorded. (Menstrual status: IUD). Last pap 05/29/16. Results were:  normal The planned method of family planning is abstinence Pertinent History Reviewed:   Reviewed past medical,surgical, social, obstetrical and family history.  Reviewed problem list, medications and allergies. Objective Findings & Procedure:    Vitals:   09/03/18 1355  BP: 129/80  Pulse: 94  Weight: 200 lb 12.8 oz (91.1 kg)  Height: 5\' 1"  (1.549 m)  Body mass index is 37.94 kg/m.  Results for orders placed or performed in visit on 09/03/18 (from the past 24 hour(s))  POCT Wet Prep [facility])   Collection Time: 09/03/18  2:33 PM  Result Value Ref Range   Source Wet Prep POC vaginal    WBC, Wet Prep HPF POC few    Bacteria Wet Prep HPF POC Few Few   BACTERIA WET PREP MORPHOLOGY POC     Clue Cells Wet Prep HPF POC Many (A) None   Clue Cells Wet Prep Whiff POC Positive Whiff    Yeast Wet Prep HPF POC None None   KOH Wet Prep POC     Trichomonas Wet Prep HPF POC Absent Absent     Time out was performed.  A graves speculum was placed in the vagina.  The cervix was visualized, and the strings were visible. They were grasped and the Mirena IUD was removed with a small amount of resistance, but came out without complication, intact.  Pt states pain as I was removing IUD was same pain she's been feeling. The patient tolerated the procedure well.  Assessment & Plan:   1) Mirena IUD removal  2) BV> Rx metronidazole 500mg  BID x 7d for BV, no sex or etoh while taking. Was taking  tessalon perles and metronidazole at same time in 2011 and had allergy- can't remember what type, but wasn't sure which med it was from. She wants to try metronidazole again. If rxn to stop med and call us, take benadryl if needed.   3) STD screen> gc/ct  Orders Placed This Encounter  Procedures  . GC/Chlamydia Probe Amp  . POCT Wet Prep [facility])    Follow-up: Return for Jan for , Pap & physical.  Cheral Marker CNM, [facility] 09/03/2018 2:38 PM

## 2018-09-05 LAB — GC/CHLAMYDIA PROBE AMP
Chlamydia trachomatis, NAA: NEGATIVE
Neisseria Gonorrhoeae by PCR: NEGATIVE

## 2018-09-07 ENCOUNTER — Telehealth: Payer: Self-pay | Admitting: Women's Health

## 2018-09-07 ENCOUNTER — Telehealth: Payer: Self-pay | Admitting: *Deleted

## 2018-09-07 NOTE — Telephone Encounter (Signed)
Calling to get test results from last week

## 2018-09-07 NOTE — Telephone Encounter (Signed)
Pt requesting results for STD testing.  Informed negative for Gc/CHL. Verbalized understanding.

## 2018-12-14 ENCOUNTER — Telehealth: Payer: Self-pay | Admitting: Women's Health

## 2018-12-14 NOTE — Telephone Encounter (Signed)
Pt states for the last few days she has been having a lot of bleeding with clots and requesting to discuss with a nurse or to get an appt.

## 2018-12-15 ENCOUNTER — Other Ambulatory Visit: Payer: Self-pay | Admitting: Obstetrics & Gynecology

## 2018-12-15 MED ORDER — MEGESTROL ACETATE 40 MG PO TABS: ORAL_TABLET | ORAL | 3 refills | Status: DC

## 2018-12-15 NOTE — Telephone Encounter (Signed)
Called patient back and heard message that mailbox is full. Will send mychart message to patient.

## 2019-03-04 ENCOUNTER — Emergency Department (HOSPITAL_COMMUNITY): Payer: 59

## 2019-03-04 ENCOUNTER — Emergency Department (HOSPITAL_COMMUNITY)
Admission: EM | Admit: 2019-03-04 | Discharge: 2019-03-04 | Disposition: A | Discharge status: Home / Self Care | Payer: 59 | Attending: Emergency Medicine | Admitting: Emergency Medicine

## 2019-03-04 ENCOUNTER — Encounter (HOSPITAL_COMMUNITY): Payer: Self-pay | Admitting: *Deleted

## 2019-03-04 ENCOUNTER — Other Ambulatory Visit: Payer: Self-pay

## 2019-03-04 DIAGNOSIS — Z7982 Long term (current) use of aspirin: Secondary | ICD-10-CM | POA: Insufficient documentation

## 2019-03-04 DIAGNOSIS — R0789 Other chest pain: Secondary | ICD-10-CM | POA: Insufficient documentation

## 2019-03-04 DIAGNOSIS — Z8719 Personal history of other diseases of the digestive system: Secondary | ICD-10-CM

## 2019-03-04 LAB — CBC WITH DIFFERENTIAL/PLATELET
Abs Immature Granulocytes: 0.03 10*3/uL (ref 0.00–0.07)
Basophils Absolute: 0.1 10*3/uL (ref 0.0–0.1)
Basophils Relative: 1 %
Eosinophils Absolute: 0.2 10*3/uL (ref 0.0–0.5)
Eosinophils Relative: 2 %
HCT: 42.5 % (ref 36.0–46.0)
Hemoglobin: 12.9 g/dL (ref 12.0–15.0)
Immature Granulocytes: 0 %
Lymphocytes Relative: 25 %
Lymphs Abs: 2.7 10*3/uL (ref 0.7–4.0)
MCH: 27.8 pg (ref 26.0–34.0)
MCHC: 30.4 g/dL (ref 30.0–36.0)
MCV: 91.6 fL (ref 80.0–100.0)
Monocytes Absolute: 0.5 10*3/uL (ref 0.1–1.0)
Monocytes Relative: 5 %
Neutro Abs: 7.1 10*3/uL (ref 1.7–7.7)
Neutrophils Relative %: 67 %
Platelets: 265 10*3/uL (ref 150–400)
RBC: 4.64 MIL/uL (ref 3.87–5.11)
RDW: 14 % (ref 11.5–15.5)
WBC: 10.6 10*3/uL — ABNORMAL HIGH (ref 4.0–10.5)
nRBC: 0 % (ref 0.0–0.2)

## 2019-03-04 LAB — LIPASE, BLOOD: Lipase: 27 U/L (ref 11–51)

## 2019-03-04 LAB — COMPREHENSIVE METABOLIC PANEL
ALT: 30 U/L (ref 0–44)
AST: 19 U/L (ref 15–41)
Albumin: 3.9 g/dL (ref 3.5–5.0)
Alkaline Phosphatase: 94 U/L (ref 38–126)
Anion gap: 10 (ref 5–15)
BUN: 8 mg/dL (ref 6–20)
CO2: 27 mmol/L (ref 22–32)
Calcium: 9.5 mg/dL (ref 8.9–10.3)
Chloride: 103 mmol/L (ref 98–111)
Creatinine, Ser: 0.77 mg/dL (ref 0.44–1.00)
GFR calc Af Amer: >60 mL/min (ref >60)
GFR calc non Af Amer: >60 mL/min (ref >60)
Glucose, Bld: 101 mg/dL — ABNORMAL HIGH (ref 70–99)
Potassium: 3.5 mmol/L (ref 3.5–5.1)
Sodium: 140 mmol/L (ref 135–145)
Total Bilirubin: 0.5 mg/dL (ref 0.3–1.2)
Total Protein: 7.7 g/dL (ref 6.5–8.1)

## 2019-03-04 LAB — TROPONIN I (HIGH SENSITIVITY)
Troponin I (High Sensitivity): <2 ng/L (ref <18)
Troponin I (High Sensitivity): <2 ng/L (ref <18)

## 2019-03-04 MED ORDER — ACETAMINOPHEN 500 MG PO TABS: 1000.0000 mg | ORAL_TABLET | Freq: Once | ORAL | Status: DC

## 2019-03-04 MED ORDER — MELOXICAM 7.5 MG PO TABS: 7.5000 mg | ORAL_TABLET | Freq: Once | ORAL | Status: DC

## 2019-03-04 MED ORDER — ONDANSETRON HCL 4 MG PO TABS: 4.0000 mg | ORAL_TABLET | Freq: Once | ORAL | Status: DC

## 2019-03-04 NOTE — ED Provider Notes (Signed)
Received patient at change of shift.  CC: Chest Pain.  Patient is a 33 year old female who presents to the emergency department with chest pain radiating to the mid back.  Patient states she has a history of gallstones.  There is been no reported fever.  No excessive vomiting reported.  Work-up is ongoing.  Lipase and comprehensive metabolic panel results are encouraging.  Second troponin is pending.  Second troponin less than 2.    Recheck.  Patient is not having any vomiting.  She states she is ready to go home.  Patient states that she has several stones in her gallbladder.  And she has been dealing with this issue off and on for a few years.  She has medication at home both for nausea and for pain.  I have asked the patient to see her primary physician or return to the emergency department if there are any worsening of her symptoms, changes in her condition, problems, or concerns.  Dx Chest wall pain. Hx of gallstones   Lily Kocher, PA-C 03/05/19 1133    Lucrezia Starch, MD 03/05/19 [phone]

## 2019-03-04 NOTE — ED Provider Notes (Signed)
[place] EMERGENCY DEPARTMENT Provider Note  [facility] CSN: [id number] Arrival date & time: 03/04/19  1745     History   Chief Complaint Chief Complaint  Patient presents with  . Chest Pain    HPI Elinor DodgeHeather F Kackley is a 33 y.o. female with history of obesity, PCOS who presents with chest pain that began yesterday.  She describes it as a pressure over her sternum.  It radiates to her back.  She has had some nausea, but no vomiting.  She denies any pleuritic symptoms or shortness of breath.  She also denies any fevers, abdominal pain.  Patient reports having history of gallstones, but this is not similar.  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  Patient took 2 325mg  aspirin without relief.  Patient denies any recent long trips, surgeries, new leg pain or swelling, history of blood clots, exogenous estrogen use.     HPI  Past Medical History:  Diagnosis Date  . Gall stones   . Vaginal Pap smear, abnormal     Patient Active Problem List   Diagnosis Date Noted  . PCOS (polycystic ovarian syndrome) 08/01/2016  . Abnormal Pap smear of cervix 01/02/2015  . Elevated TSH 12/26/2014  . Menorrhagia with irregular cycle 12/07/2014  . Hirsutism 12/07/2014  . Obesity 12/07/2014    Past Surgical History:  Procedure Laterality Date  . CYST EXCISION Left    AXILLARY     OB History    Gravida  2   Para  2   Term  2   Preterm  0   AB  0   Living  2     SAB  0   TAB  0   Ectopic  0   Multiple  0   Live Births  2            Home Medications    Prior to Admission medications   Medication Sig Start Date End Date Taking? Authorizing Provider  aspirin 325 MG EC tablet Take 325 mg by mouth daily.   Yes [provider]  megestrol (MEGACE) 40 MG tablet 3 tablets a day for 5 days, 2 tablets a day for 5 days then 1 tablet daily Patient not taking: Reported on 03/04/2019 12/15/18   Lazaro ArmsEure, Luther H, MD  metroNIDAZOLE (FLAGYL) 500 MG tablet Take  1 tablet (500 mg total) by mouth 2 (two) times daily. Patient not taking: Reported on 03/04/2019 09/03/18   Cheral MarkerBooker, Kimberly R, CNM    Family History Family History  Problem Relation Age of Onset  . Heart disease Father   . Hypertension Father   . Stroke Father   . Cancer Maternal Aunt   . Diabetes Maternal Grandmother   . Stroke Maternal Grandmother   . Heart disease Maternal Grandfather   . Stroke Maternal Grandfather   . Heart disease Paternal Grandfather     Social History Social History   Tobacco Use  . Smoking status: Never Smoker  . Smokeless tobacco: Never Used  Substance Use Topics  . Alcohol use: No  . Drug use: No     Allergies   Bee venom, Flagyl [metronidazole], Keflex [cephalexin], Mucinex dm [dm-guaifenesin er], Penicillins, Tessalon [benzonatate], and Tylox [oxycodone-acetaminophen]   Review of Systems Review of Systems  Constitutional: Negative for chills and fever.  HENT: Negative for facial swelling and sore throat.   Respiratory: Negative for shortness of breath.   Cardiovascular: Positive for chest pain.  Gastrointestinal: Positive  for nausea. Negative for abdominal pain and vomiting.  Genitourinary: Negative for dysuria.  Musculoskeletal: Negative for back pain.  Skin: Negative for rash and wound.  Neurological: Negative for headaches.  Psychiatric/Behavioral: The patient is not nervous/anxious.      Physical Exam Updated Vital Signs BP 117/70   Pulse 72   Temp 98.7 F (37.1 C) (Oral)   Resp 14   Ht 5' (1.524 m)   Wt 94.3 kg   LMP 01/20/2019   SpO2 99%   BMI 40.62 kg/m   Physical Exam Vitals signs and nursing note reviewed.  Constitutional:      General: She is not in acute distress.    Appearance: She is well-developed. She is not diaphoretic.  HENT:     Head: Normocephalic and atraumatic.     Mouth/Throat:     Pharynx: No oropharyngeal exudate.  Eyes:     General: No scleral icterus.       Right eye: No discharge.         Left eye: No discharge.     Conjunctiva/sclera: Conjunctivae normal.     Pupils: Pupils are equal, round, and reactive to light.  Neck:     Musculoskeletal: Normal range of motion and neck supple.     Thyroid: No thyromegaly.  Cardiovascular:     Rate and Rhythm: Normal rate and regular rhythm.     Heart sounds: Normal heart sounds. No murmur. No friction rub. No gallop.   Pulmonary:     Effort: Pulmonary effort is normal. No respiratory distress.     Breath sounds: Normal breath sounds. No stridor. No wheezing or rales.  Chest:     Chest wall: Tenderness (sternal) present.  Abdominal:     General: Bowel sounds are normal. There is no distension.     Palpations: Abdomen is soft.     Tenderness: There is no abdominal tenderness. There is no guarding or rebound.  Lymphadenopathy:     Cervical: No cervical adenopathy.  Skin:    General: Skin is warm and dry.     Coloration: Skin is not pale.     Findings: No rash.  Neurological:     Mental Status: She is alert.     Coordination: Coordination normal.      ED Treatments / Results  Labs (all labs ordered are listed, but only abnormal results are displayed) Labs Reviewed  CBC WITH DIFFERENTIAL/PLATELET - Abnormal; Notable for the following components:      Result Value   WBC 10.6 (*)    All other components within normal limits  COMPREHENSIVE METABOLIC PANEL - Abnormal; Notable for the following components:   Glucose, Bld 101 (*)    All other components within normal limits  LIPASE, BLOOD  TROPONIN I (HIGH SENSITIVITY)  TROPONIN I (HIGH SENSITIVITY)    EKG None  Radiology Dg Chest 2 View  Result Date: 03/04/2019 CLINICAL DATA:  Chest pain EXAM: CHEST - 2 VIEW COMPARISON:  None. FINDINGS: No consolidation, features of edema, pneumothorax, or effusion. Pulmonary vascularity is normally distributed. The cardiomediastinal contours are unremarkable. No acute osseous or soft tissue abnormality. IMPRESSION: No acute  cardiopulmonary abnormality. Electronically Signed   By: Lovena Le M.D.   On: 03/04/2019 20:49    Procedures Procedures (including critical care time)  Medications Ordered in ED Medications - No data to display   Initial Impression / Assessment and Plan / ED Course  I have reviewed the triage vital signs and the nursing notes.  Pertinent  labs & imaging results that were available during my care of the patient were reviewed by me and considered in my medical decision making (see chart for details).        Patient presenting with chest pain.  Patient reports this is happened before, but has not lasted this long.  It is reproducible on palpation.  Suspect chest wall pain. First troponin negative. Labs otherwise unremarkable.  Chest x-ray is clear.  EKG shows NSR. At shift change, patient care transferred to [facility], PA-C for continued evaluation, follow up of second troponin and determination of disposition. Anticipate discharge if negatve. Plan to send home with NSAID.     Final Clinical Impressions(s) / ED Diagnoses   Final diagnoses:  Chest wall pain  Hx of gallstones    ED Discharge Orders    None       Emi Holes, PA-C 03/05/19 1503    Milagros Loll, MD 03/05/19 [phone]

## 2019-03-04 NOTE — Discharge Instructions (Addendum)
You heart enzymes are negative for acute event. Your chest xray is negative for acute problem. Your D-Dimer test for blood clots was also negative. Please use the ibuprofen every 6 hours as needed for pain or discomfort. You may add tylenol between the doses if needed. Please see your primary MD or return to the Emergency Dept if any worsening of your symptoms or changes in your condition.

## 2019-03-04 NOTE — ED Triage Notes (Signed)
Chest pain in mid area radiating into back, history of gallstones

## 2019-03-15 ENCOUNTER — Other Ambulatory Visit: Payer: Self-pay

## 2019-03-15 ENCOUNTER — Encounter: Payer: Self-pay | Admitting: Obstetrics & Gynecology

## 2019-03-15 ENCOUNTER — Ambulatory Visit (INDEPENDENT_AMBULATORY_CARE_PROVIDER_SITE_OTHER): Payer: 59 | Admitting: Obstetrics & Gynecology

## 2019-03-15 VITALS — BP 110/65 | HR 68 | Ht 61.75 in | Wt 206.0 lb

## 2019-03-15 DIAGNOSIS — N921 Excessive and frequent menstruation with irregular cycle: Secondary | ICD-10-CM | POA: Diagnosis not present

## 2019-03-15 DIAGNOSIS — N946 Dysmenorrhea, unspecified: Secondary | ICD-10-CM | POA: Diagnosis not present

## 2019-03-15 DIAGNOSIS — Z3202 Encounter for pregnancy test, result negative: Secondary | ICD-10-CM | POA: Diagnosis not present

## 2019-03-15 LAB — POCT URINE PREGNANCY: Preg Test, Ur: NEGATIVE

## 2019-03-15 LAB — POCT HEMOGLOBIN: Hemoglobin: 12.6 g/dL (ref 11–14.6)

## 2019-03-15 MED ORDER — MEGESTROL ACETATE 40 MG PO TABS: ORAL_TABLET | ORAL | 3 refills | Status: DC

## 2019-03-15 MED ORDER — DESOGESTREL-ETHINYL ESTRADIOL 0.15-30 MG-MCG PO TABS: 1.0000 | ORAL_TABLET | Freq: Every day | ORAL | 11 refills | Status: DC

## 2019-03-15 NOTE — Progress Notes (Signed)
Chief Complaint  Patient presents with  . Menorrhagia    Blood pressure 110/65, pulse 68, height 5' 1.75" (1.568 m), weight 206 lb (93.4 kg), last menstrual period 03/14/2019.  33 y.o. [id number] Patient's last menstrual period was 03/14/2019 (exact date). The current method of family planning is none.  Subjective  Periods: long stanidng irregular Bleeds:  10 days Clots:  yes Products Used:  Soils:yes Pain with menses:yes Pain at other times of month:no Pain with intercourse:yes Bleeding with intercourse:no     Objective  General:  WDWN NAD Abdomen:  Soft, non-tender, normal bowel sounds; no bruits, organomegaly or masses. Vulva:  normal appearing vulva with no masses, tenderness or lesions Vagina:  normal mucosa, no discharge Cervix:  normal Uterus:  normal size, contour, position, consistency, mobility, non-tender Adnexa: ovaries:present,  normal adnexa in size, nontender and no masses    Pertinent ROS No burning with urination, frequency or urgency No nausea, vomiting or diarrhea Nor fever chills or other constitutional symptoms   Labs or studies Hemoglobin: 12.6       Impression Diagnoses this Encounter::   ICD-10-CM   1. Menometrorrhagia  N92.1   2. Urine pregnancy test negative  Z32.02 POCT urine pregnancy  3. Dysmenorrhea  N94.6     Established relevant diagnosis(es):   Plan/Recommendations: Meds ordered this encounter  Medications  . megestrol (MEGACE) 40 MG tablet    Sig: 3 tablets a day for 5 days, 2 tablets a day for 5 days then 1 tablet daily    Dispense:  45 tablet    Refill:  3  . desogestrel-ethinyl estradiol (APRI) 0.15-30 MG-MCG tablet    Sig: Take 1 tablet by mouth daily.    Dispense:  1 Package    Refill:  11    Labs or Scans Ordered: Orders Placed This Encounter  Procedures  . POCT urine pregnancy    Management: Discussion/MDM: >stop menses >cycle with OCP >ovulation induction when requested with clomid   Follow up No follow-ups on file.        Face to face time:  15 minutes  Greater than 50% of the visit time was spent in counseling and coordination of care with the patient.  The summary and outline of the counseling and care coordination is summarized in the note above.   All questions were answered.  Past Medical History:  Diagnosis Date  . Gall stones   . Vaginal Pap smear, abnormal     Past Surgical History:  Procedure Laterality Date  . CYST EXCISION Left    AXILLARY    OB History    Gravida  2   Para  2   Term  2   Preterm  0   AB  0   Living  2     SAB  0   TAB  0   Ectopic  0   Multiple  0   Live Births  2           Allergies  Allergen Reactions  . Bee Venom   . Flagyl [Metronidazole]   . Keflex [Cephalexin]   . Mucinex Dm [Dm-Guaifenesin Er] Other (See Comments)    Looses balance  . Penicillins   . Tessalon [Benzonatate]   . Tylox [Oxycodone-Acetaminophen]     Social History   Socioeconomic History  . Marital status: Legally Separated    Spouse name: Not on file  . Number of children: Not on file  . Years of education: Not  on file  . Highest education level: Not on file  Occupational History  . Not on file  Social Needs  . Financial resource strain: Not on file  . Food insecurity    Worry: Not on file    Inability: Not on file  . Transportation needs    Medical: Not on file    Non-medical: Not on file  Tobacco Use  . Smoking status: Never Smoker  . Smokeless tobacco: Never Used  Substance and Sexual Activity  . Alcohol use: No  . Drug use: No  . Sexual activity: Yes    Birth control/protection: None  Lifestyle  . Physical activity    Days per week: Not on file    Minutes per session: Not on file  . Stress: Not on file  Relationships  . Social Musician on phone: Not on file    Gets together: Not on file    Attends religious service: Not on file    Active member of club or organization: Not on  file    Attends meetings of clubs or organizations: Not on file    Relationship status: Not on file  Other Topics Concern  . Not on file  Social History Narrative  . Not on file    Family History  Problem Relation Age of Onset  . Heart disease Father   . Hypertension Father   . Stroke Father   . Cancer Maternal Aunt   . Diabetes Maternal Grandmother   . Stroke Maternal Grandmother   . Heart disease Maternal Grandfather   . Stroke Maternal Grandfather   . Heart disease Paternal Grandfather

## 2019-03-15 NOTE — Addendum Note (Signed)
Addended by: Armond Hang on: 03/15/2019 04:46 PM   Modules accepted: Orders

## 2019-05-11 ENCOUNTER — Other Ambulatory Visit: Payer: Self-pay | Admitting: Obstetrics & Gynecology

## 2019-05-11 MED ORDER — CLOMIPHENE CITRATE 50 MG PO TABS: 50.0000 mg | ORAL_TABLET | Freq: Every day | ORAL | 5 refills | Status: DC

## 2019-06-16 ENCOUNTER — Telehealth: Payer: Self-pay | Admitting: Obstetrics & Gynecology

## 2019-06-16 MED ORDER — MEDROXYPROGESTERONE ACETATE 10 MG PO TABS: 10.0000 mg | ORAL_TABLET | Freq: Every day | ORAL | 0 refills | Status: DC

## 2019-06-18 ENCOUNTER — Encounter: Payer: Self-pay | Admitting: Obstetrics & Gynecology

## 2019-06-18 NOTE — Progress Notes (Signed)
June 18, 2019   Patient: Kathleen Chan  Date of Birth: 1985-08-27  Date of Visit: 06/18/2019    To Whom It May Concern:  It is my medical opinion that [name] should remain out of work until 06/21/19 due to medical issues.  If you have any questions or concerns, please don't hesitate to call.  Sincerely,    Lazaro Arms, MD 06/18/2019 9:54 AM

## 2019-08-18 ENCOUNTER — Other Ambulatory Visit: Payer: Self-pay | Admitting: Obstetrics & Gynecology

## 2019-08-18 MED ORDER — CLOMIPHENE CITRATE 50 MG PO TABS: 100.0000 mg | ORAL_TABLET | Freq: Every day | ORAL | 5 refills | Status: DC

## 2019-11-16 ENCOUNTER — Encounter: Payer: Self-pay | Admitting: Women's Health

## 2019-11-16 ENCOUNTER — Other Ambulatory Visit (HOSPITAL_COMMUNITY)
Admission: RE | Admit: 2019-11-16 | Discharge: 2019-11-16 | Disposition: A | Discharge status: Home / Self Care | Payer: PRIVATE HEALTH INSURANCE | Source: Ambulatory Visit | Attending: Obstetrics & Gynecology | Admitting: Obstetrics & Gynecology

## 2019-11-16 ENCOUNTER — Ambulatory Visit (INDEPENDENT_AMBULATORY_CARE_PROVIDER_SITE_OTHER): Payer: No Typology Code available for payment source | Admitting: Women's Health

## 2019-11-16 ENCOUNTER — Other Ambulatory Visit: Payer: Self-pay

## 2019-11-16 VITALS — BP 109/67 | HR 77 | Ht 60.0 in | Wt 209.2 lb

## 2019-11-16 DIAGNOSIS — Z1321 Encounter for screening for nutritional disorder: Secondary | ICD-10-CM

## 2019-11-16 DIAGNOSIS — E282 Polycystic ovarian syndrome: Secondary | ICD-10-CM | POA: Diagnosis not present

## 2019-11-16 DIAGNOSIS — Z01419 Encounter for gynecological examination (general) (routine) without abnormal findings: Secondary | ICD-10-CM | POA: Insufficient documentation

## 2019-11-16 DIAGNOSIS — Z6841 Body Mass Index (BMI) 40.0 and over, adult: Secondary | ICD-10-CM

## 2019-11-16 DIAGNOSIS — N926 Irregular menstruation, unspecified: Secondary | ICD-10-CM | POA: Diagnosis not present

## 2019-11-16 DIAGNOSIS — Z3202 Encounter for pregnancy test, result negative: Secondary | ICD-10-CM | POA: Diagnosis not present

## 2019-11-16 DIAGNOSIS — Z1329 Encounter for screening for other suspected endocrine disorder: Secondary | ICD-10-CM

## 2019-11-16 DIAGNOSIS — Z131 Encounter for screening for diabetes mellitus: Secondary | ICD-10-CM

## 2019-11-16 DIAGNOSIS — Z13220 Encounter for screening for lipoid disorders: Secondary | ICD-10-CM

## 2019-11-16 LAB — POCT URINE PREGNANCY: Preg Test, Ur: NEGATIVE

## 2019-11-16 NOTE — Progress Notes (Signed)
WELL-WOMAN EXAMINATION Patient name: Kathleen Chan MRN [id number]  Date of birth: 1985/12/31 Chief Complaint:   Late Period (taking clomid, negative pregancy test.spotted this morning)  History of Present Illness:   Kathleen Chan is a 34 y.o. G1P2002 Caucasian female being seen today for a routine well-woman exam.  Current complaints: has PCOS, had Mirena IUD removed 09/03/18, had regular periods after, then had prolonged heavy period in August that ended up stopping on its own before had to use megace. Then again in November, and did have to use megace that time. States hasn't had a regular period since. Decided she wanted to try for pregnancy, so took clomid 50mg  in Feb. No period at all in March. April only had spotting, clomid increased to 100mg , took 4/20-4/25. No period/bleeding again until 7/6 when began lightly spotting again- has been intermittent, not enough to wear a pad. Neg HPTs. No n/v or pregnancy sx. Wants to check hcg and if neg thinks she wants to restart COCs for awhile while she works on weight loss- is staying active/walking, doing better w/ diet and is not losing any weight. Wants to see about getting on meds to help lose weight before she tries again for pregnancy.   Depression screen [facility] 2/9 01/13/2014 09/01/2013  Decreased Interest 0 0  Down, Depressed, Hopeless 0 0  PHQ - 2 Score 0 0     PCP: [facility]      does desire labs, not fasting today, and does want to check lipids Patient's last menstrual period was 08/24/2019. The current method of family planning is none.  Last pap 05/29/16. Results were: normal. H/O abnormal pap: yes 12/26/14: atypical glandular cells favored to be endocervical/ASCUS w/ -HRHPV, repeat was neg Last mammogram: never. Results were: N/A. Family h/o breast cancer: no Last colonoscopy: never. Results were: N/A. Family h/o colorectal cancer: no Review of Systems:   Pertinent items are noted in HPI Denies any headaches, blurred vision, fatigue,  shortness of breath, chest pain, abdominal pain, abnormal vaginal discharge/itching/odor/irritation, problems with periods, bowel movements, urination, or intercourse unless otherwise stated above. Pertinent History Reviewed:  Reviewed past medical,surgical, social and family history.  Reviewed problem list, medications and allergies. Physical Assessment:   Vitals:   11/16/19 1603  BP: 109/67  Pulse: 77  Weight: 209 lb 3.2 oz (94.9 kg)  Height: 5' (1.524 m)  Body mass index is 40.86 kg/m.        Physical Examination:   General appearance - well appearing, and in no distress  Mental status - alert, oriented to person, place, and time  Psych:  She has a normal mood and affect  Skin - warm and dry, normal color, no suspicious lesions noted  Chest - effort normal, all lung fields clear to auscultation bilaterally  Heart - normal rate and regular rhythm  Neck:  midline trachea, no thyromegaly or nodules  Breasts - breasts appear normal, no suspicious masses, no skin or nipple changes or  axillary nodes  Abdomen - soft, nontender, nondistended, no masses or organomegaly  Pelvic - VULVA: normal appearing vulva with no masses, tenderness or lesions  VAGINA: normal appearing vagina with normal color and discharge, no lesions. Dark red/brown nonodorous d/c  CERVIX: normal appearing cervix without discharge or lesions, no CMT  Thin prep pap is done w/ HR HPV cotesting  UTERUS: uterus is felt to be normal size, shape, consistency and nontender   ADNEXA: No adnexal masses or tenderness noted.  Extremities:  No swelling  or varicosities noted  Chaperone: pt declined    Results for orders placed or performed in visit on 11/16/19 (from the past 24 hour(s))  POCT urine pregnancy   Collection Time: 11/16/19  4:22 PM  Result Value Ref Range   Preg Test, Ur Negative Negative    Assessment & Plan:  1) Well-Woman Exam  2) PCOS w/ irregular periods> was trying to get pregnant, has done 2 rounds of  clomid w/o success. Checking hcg today to make sure. If neg pt wants to get back on coc's while she tries to lose weight, then try again for pregnancy  3) BMI 40> has been increasing activity/walking, doing better w/ diet and is still not losing weight, wants to discuss trying weight loss meds- will make appt w/ Jenn  Labs/procedures today: pap, hcg. Coming back Friday am for fasting labs  Mammogram @34yo  or sooner if problems Colonoscopy @34yo  or sooner if problems  Orders Placed This Encounter  Procedures   Beta hCG quant (ref lab)   CBC   Comprehensive metabolic panel   TSH   Lipid panel   Hemoglobin A1c   VITAMIN D 25 Hydroxy (Vit-D Deficiency, Fractures)   POCT urine pregnancy    Meds: No orders of the defined types were placed in this encounter.   Follow-up: Return for Friday am for fasting labs, then 1st available virtual appt w/ Jenn to discuss weight loss meds.  CNM, [facility] 11/16/2019 5:10 PM

## 2019-11-17 LAB — BETA HCG QUANT (REF LAB): hCG Quant: <1 m[IU]/mL

## 2019-11-19 LAB — CYTOLOGY - PAP
Comment: NEGATIVE
Diagnosis: NEGATIVE
High risk HPV: NEGATIVE

## 2019-11-20 LAB — COMPREHENSIVE METABOLIC PANEL
ALT: 23 IU/L (ref 0–32)
AST: 16 IU/L (ref 0–40)
Albumin/Globulin Ratio: 1.5 (ref 1.2–2.2)
Albumin: 4 g/dL (ref 3.8–4.8)
Alkaline Phosphatase: 107 IU/L (ref 48–121)
BUN/Creatinine Ratio: 12 (ref 9–23)
BUN: 9 mg/dL (ref 6–20)
Bilirubin Total: 0.3 mg/dL (ref 0.0–1.2)
CO2: 25 mmol/L (ref 20–29)
Calcium: 9 mg/dL (ref 8.7–10.2)
Chloride: 104 mmol/L (ref 96–106)
Creatinine, Ser: 0.75 mg/dL (ref 0.57–1.00)
GFR calc Af Amer: 121 mL/min/{1.73_m2} (ref >59)
GFR calc non Af Amer: 105 mL/min/{1.73_m2} (ref >59)
Globulin, Total: 2.6 g/dL (ref 1.5–4.5)
Glucose: 85 mg/dL (ref 65–99)
Potassium: 3.9 mmol/L (ref 3.5–5.2)
Sodium: 142 mmol/L (ref 134–144)
Total Protein: 6.6 g/dL (ref 6.0–8.5)

## 2019-11-20 LAB — CBC
Hematocrit: 36.7 % (ref 34.0–46.6)
Hemoglobin: 11.8 g/dL (ref 11.1–15.9)
MCH: 28.2 pg (ref 26.6–33.0)
MCHC: 32.2 g/dL (ref 31.5–35.7)
MCV: 88 fL (ref 79–97)
Platelets: 241 10*3/uL (ref 150–450)
RBC: 4.18 x10E6/uL (ref 3.77–5.28)
RDW: 13.5 % (ref 11.7–15.4)
WBC: 8.2 10*3/uL (ref 3.4–10.8)

## 2019-11-20 LAB — LIPID PANEL
Chol/HDL Ratio: 4.7 ratio — ABNORMAL HIGH (ref 0.0–4.4)
Cholesterol, Total: 170 mg/dL (ref 100–199)
HDL: 36 mg/dL — ABNORMAL LOW (ref >39)
LDL Chol Calc (NIH): 122 mg/dL — ABNORMAL HIGH (ref 0–99)
Triglycerides: 60 mg/dL (ref 0–149)
VLDL Cholesterol Cal: 12 mg/dL (ref 5–40)

## 2019-11-20 LAB — HEMOGLOBIN A1C
Est. average glucose Bld gHb Est-mCnc: 111 mg/dL
Hgb A1c MFr Bld: 5.5 % (ref 4.8–5.6)

## 2019-11-20 LAB — VITAMIN D 25 HYDROXY (VIT D DEFICIENCY, FRACTURES): Vit D, 25-Hydroxy: 30 ng/mL (ref 30.0–100.0)

## 2019-11-20 LAB — TSH: TSH: 2.73 u[IU]/mL (ref 0.450–4.500)

## 2019-11-25 ENCOUNTER — Telehealth (INDEPENDENT_AMBULATORY_CARE_PROVIDER_SITE_OTHER): Payer: No Typology Code available for payment source | Admitting: Adult Health

## 2019-11-25 ENCOUNTER — Encounter: Payer: Self-pay | Admitting: Adult Health

## 2019-11-25 VITALS — Ht 60.0 in | Wt 209.8 lb

## 2019-11-25 DIAGNOSIS — Z713 Dietary counseling and surveillance: Secondary | ICD-10-CM

## 2019-11-25 DIAGNOSIS — Z6841 Body Mass Index (BMI) 40.0 and over, adult: Secondary | ICD-10-CM | POA: Diagnosis not present

## 2019-11-25 MED ORDER — PHENTERMINE HCL 15 MG PO CAPS
15.0000 mg | ORAL_CAPSULE | ORAL | 0 refills | Status: DC
Start: 2019-11-25 — End: 2019-12-23

## 2019-11-25 NOTE — Progress Notes (Signed)
Patient ID: Kathleen Chan, female   DOB: [birth year], 34 y.o.   MRN: [id number]   TELEHEALTH GYNECOLOGY VISIT ENCOUNTER NOTE  I connected with Kathleen Chan on 11/25/19 at  1:50 PM EDT by telephone at home and verified that I am speaking with the correct person using two identifiers.   I discussed the limitations, risks, security and privacy concerns of performing an evaluation and management service by telephone and the availability of in person appointments. I also discussed with the patient that there may be a patient responsible charge related to this service. The patient expressed understanding and agreed to proceed.   History:  Kathleen Chan is a 34 y.o. [phone] female being evaluated today for weight loss medications. She can not lose weight, and feels tired, but works 12 hours.  She denies any other concerns.   She is on OCs.      Past Medical History:  Diagnosis Date   Gall stones    PVC (premature ventricular contraction)    Vaginal Pap smear, abnormal    Past Surgical History:  Procedure Laterality Date   CYST EXCISION Left    AXILLARY   The following portions of the patient's history were reviewed and updated as appropriate: allergies, current medications, past family history, past medical history, past social history, past surgical history and problem list.   Health Maintenance:  Normal pap and negative HRHPV on 11/16/19.  Review of Systems:  Pertinent items noted in HPI and remainder of comprehensive ROS otherwise negative.  Physical Exam:   General:  Alert, oriented and cooperative.   Mental Status: Normal mood and affect perceived. Normal judgment and thought content.  Physical exam deferred due to nature of the encounter Ht 5' (1.524 m)    Wt (!) 209 lb 12.8 oz (95.2 kg)    BMI 40.97 kg/m   BP was 109/67 on 11/16/19.   Labs and Imaging Results for orders placed or performed in visit on 11/16/19 (from the past 336 hour(s))  POCT urine pregnancy    Collection Time: 11/16/19  4:22 PM  Result Value Ref Range   Preg Test, Ur Negative Negative  Cytology - PAP( [place])   Collection Time: 11/16/19  4:34 PM  Result Value Ref Range   High risk HPV Negative    Adequacy      Satisfactory for evaluation; transformation zone component PRESENT.   Diagnosis      - Negative for intraepithelial lesion or malignancy (NILM)   Comment Normal Reference Range HPV - Negative   Beta hCG quant (ref lab)   Collection Time: 11/16/19  5:00 PM  Result Value Ref Range   hCG Quant <1 mIU/mL  CBC   Collection Time: 11/19/19 10:28 AM  Result Value Ref Range   WBC 8.2 3.4 - 10.8 x10E3/uL   RBC 4.18 3.77 - 5.28 x10E6/uL   Hemoglobin 11.8 11.1 - 15.9 g/dL   Hematocrit 26.8 34.1 - 46.6 %   MCV 88 79 - 97 fL   MCH 28.2 26.6 - 33.0 pg   MCHC 32.2 31 - 35 g/dL   RDW 96.2 22.9 - 79.8 %   Platelets 241 150 - 450 x10E3/uL  Comprehensive metabolic panel   Collection Time: 11/19/19 10:28 AM  Result Value Ref Range   Glucose 85 65 - 99 mg/dL   BUN 9 6 - 20 mg/dL   Creatinine, Ser 9.21 0.57 - 1.00 mg/dL   GFR calc non Af Amer 105 >59 mL/min/1.73  GFR calc Af Amer 121 >59 mL/min/1.73   BUN/Creatinine Ratio 12 9 - 23   Sodium 142 134 - 144 mmol/L   Potassium 3.9 3.5 - 5.2 mmol/L   Chloride 104 96 - 106 mmol/L   CO2 25 20 - 29 mmol/L   Calcium 9.0 8.7 - 10.2 mg/dL   Total Protein 6.6 6.0 - 8.5 g/dL   Albumin 4.0 3.8 - 4.8 g/dL   Globulin, Total 2.6 1.5 - 4.5 g/dL   Albumin/Globulin Ratio 1.5 1.2 - 2.2   Bilirubin Total 0.3 0.0 - 1.2 mg/dL   Alkaline Phosphatase 107 48 - 121 IU/L   AST 16 0 - 40 IU/L   ALT 23 0 - 32 IU/L  TSH   Collection Time: 11/19/19 10:28 AM  Result Value Ref Range   TSH 2.730 0.450 - 4.500 uIU/mL  Lipid panel   Collection Time: 11/19/19 10:28 AM  Result Value Ref Range   Cholesterol, Total 170 100 - 199 mg/dL   Triglycerides 60 0 - 149 mg/dL   HDL 36 (L) >32 mg/dL   VLDL Cholesterol Cal 12 5 - 40 mg/dL   LDL Chol Calc  (NIH) 122 (H) 0 - 99 mg/dL   Chol/HDL Ratio 4.7 (H) 0.0 - 4.4 ratio  Hemoglobin A1c   Collection Time: 11/19/19 10:28 AM  Result Value Ref Range   Hgb A1c MFr Bld 5.5 4.8 - 5.6 %   Est. average glucose Bld gHb Est-mCnc 111 mg/dL  VITAMIN D 25 Hydroxy (Vit-D Deficiency, Fractures)   Collection Time: 11/19/19 10:28 AM  Result Value Ref Range   Vit D, 25-Hydroxy 30.0 30.0 - 100.0 ng/mL   No results found.    Assessment and Plan:     1. Weight loss counseling, encounter for Increase water Decrease real soda to 10 oz per day Eat more protein and less carbs and eat small frequent meals Try to exercise more  Will try phentermine Meds ordered this encounter  Medications   phentermine 15 MG capsule    Sig: Take 1 capsule (15 mg total) by mouth every morning.    Dispense:  30 capsule    Refill:  0    Order Specific Question:   Supervising Provider    Answer:   Despina Hidden, LUTHER H [[id number]]   2. Body mass index 40.0-44.9, adult (HCC)      I discussed the assessment and treatment plan with the patient. The patient was provided an opportunity to ask questions and all were answered. The patient agreed with the plan and demonstrated an understanding of the instructions.   The patient was advised to call back or seek an in-person evaluation/go to the ED if the symptoms worsen or if the condition fails to improve as anticipated.  I provided 11 minutes of non-face-to-face time during this encounter.   Cyril Mourning, NP [facility] [employer], [facility]

## 2019-12-23 ENCOUNTER — Encounter: Payer: Self-pay | Admitting: Adult Health

## 2019-12-23 ENCOUNTER — Other Ambulatory Visit: Payer: Self-pay

## 2019-12-23 ENCOUNTER — Telehealth (INDEPENDENT_AMBULATORY_CARE_PROVIDER_SITE_OTHER): Payer: No Typology Code available for payment source | Admitting: Adult Health

## 2019-12-23 VITALS — BP 120/76 | Ht 60.0 in | Wt 204.5 lb

## 2019-12-23 DIAGNOSIS — Z713 Dietary counseling and surveillance: Secondary | ICD-10-CM

## 2019-12-23 DIAGNOSIS — Z6839 Body mass index (BMI) 39.0-39.9, adult: Secondary | ICD-10-CM | POA: Diagnosis not present

## 2019-12-23 MED ORDER — PHENTERMINE HCL 8 MG PO TABS: ORAL_TABLET | ORAL | 0 refills | Status: DC

## 2019-12-23 NOTE — Progress Notes (Signed)
Patient ID: Kathleen Chan, female   DOB: 09/12/1985, 34 y.o.   MRN: [id number]    TELEHEALTH GYNECOLOGY VISIT ENCOUNTER NOTE  I connected with Kathleen Chan on 12/23/19 at  2:10 PM EDT by telephone at home and verified that I am speaking with the correct person using two identified.   I discussed the limitations, risks, security and privacy concerns of performing an evaluation and management service by telephone and the availability of in person appointments. I also discussed with the patient that there may be a patient responsible charge related to this service. The patient expressed understanding and agreed to proceed.   History:  Kathleen Chan is a 34 y.o. [phone] female being evaluated today for weight and BP check since starting phentermine 15 mg.She lost a quick 5 lbs in 1 week, did not want to eat and then she stopped taking it and felt sad, then felt better. She is going to the gym and hiking, and would like a lower dose. She denies any trouble sleeping or other concerns.       Past Medical History:  Diagnosis Date  . Gall stones   . PVC (premature ventricular contraction)   . Vaginal Pap smear, abnormal    Past Surgical History:  Procedure Laterality Date  . CYST EXCISION Left    AXILLARY   The following portions of the patient's history were reviewed and updated as appropriate: allergies, current medications, past family history, past medical history, past social history, past surgical history and problem list.   Health Maintenance:  Normal pap and negative HRHPV on 11/16/19.  Review of Systems:  Pertinent items noted in HPI and remainder of comprehensive ROS otherwise negative.  Physical Exam:   General:  Alert, oriented and cooperative.   Mental Status: Normal mood and affect perceived. Normal judgment and thought content.  Physical exam deferred due to nature of the encounter BP 120/76 (BP Location: Left Arm, Patient Position: Sitting, Cuff Size: Normal)   Ht 5'  (1.524 m)   Wt 204 lb 8 oz (92.8 kg)   LMP 12/05/2019 (Exact Date)   BMI 39.94 kg/m  Labs and Imaging No results found for this or any previous visit (from the past 336 hour(s)). No results found.    Assessment and Plan:        1. Weight loss counseling, encounter for Will try phentermine 8 mg Meds ordered this encounter  Medications  . Phentermine HCl 8 MG TABS    Sig: Take 1 before breakfast and if needed may take another before a meal    Dispense:  40 tablet    Refill:  0    Order Specific Question:   Supervising Provider    Answer:   Duane Lope H [[id number]]   Follow up in 4 weeks by telehealth   2. Body mass index 39.0-39.9, adult Continue to exercise       I discussed the assessment and treatment plan with the patient. The patient was provided an opportunity to ask questions and all were answered. The patient agreed with the plan and demonstrated an understanding of the instructions.   The patient was advised to call back or seek an in-person evaluation/go to the ED if the symptoms worsen or if the condition fails to improve as anticipated.  I provided  5 minutes of non-face-to-face time during this encounter.   Cyril Mourning, NP [facility] [employer], [facility]

## 2020-01-20 ENCOUNTER — Telehealth (INDEPENDENT_AMBULATORY_CARE_PROVIDER_SITE_OTHER): Payer: No Typology Code available for payment source | Admitting: Adult Health

## 2020-01-20 ENCOUNTER — Encounter: Payer: Self-pay | Admitting: Adult Health

## 2020-01-20 VITALS — Wt 204.0 lb

## 2020-01-20 DIAGNOSIS — Z713 Dietary counseling and surveillance: Secondary | ICD-10-CM

## 2020-01-20 DIAGNOSIS — Z6839 Body mass index (BMI) 39.0-39.9, adult: Secondary | ICD-10-CM

## 2020-01-20 NOTE — Progress Notes (Addendum)
Patient ID: Kathleen Chan, female   DOB: 1985-06-18, 34 y.o.   MRN: [id number]   TELEHEALTH GYNECOLOGY VISIT ENCOUNTER NOTE  I connected with Elinor Dodge on 01/20/20 at  1:30 PM EDT by telephone at home and verified that I am speaking with the correct person using two identifiers.   I discussed the limitations, risks, security and privacy concerns of performing an evaluation and management service by telephone and the availability of in person appointments. I also discussed with the patient that there may be a patient responsible charge related to this service. The patient expressed understanding and agreed to proceed.   History:  Kathleen Chan is a 34 y.o. [phone] female being evaluated today for weight check, she did not get the 8 mg phentermine was $50, but has maintained weight and cut back on sodas and is going to the gym. She denies any problems sleeping,or other concerns.       Past Medical History:  Diagnosis Date  . Gall stones   . PVC (premature ventricular contraction)   . Vaginal Pap smear, abnormal    Past Surgical History:  Procedure Laterality Date  . CYST EXCISION Left    AXILLARY   The following portions of the patient's history were reviewed and updated as appropriate: allergies, current medications, past family history, past medical history, past social history, past surgical history and problem list.   Health Maintenance:  Normal pap and negative HRHPV on 11/16/19.  Review of Systems:  Pertinent items noted in HPI and remainder of comprehensive ROS otherwise negative.  Physical Exam:   General:  Alert, oriented and cooperative.   Mental Status: Normal mood and affect perceived. Normal judgment and thought content.  Physical exam deferred due to nature of the encounter Wt 204 lb (92.5 kg)   BMI 39.84 kg/m  Labs and Imaging No results found for this or any previous visit (from the past 336 hour(s)). No results found.    Assessment and Plan:     1.  Weight loss counseling, encounter for Continue weight loss efforts and she may try the 15 mg phentermine she has at home every other day, and let me know if needs refills  Follow up in 4 weeks by telehealth   2. Body mass index 39.0-39.9, adult        I discussed the assessment and treatment plan with the patient. The patient was provided an opportunity to ask questions and all were answered. The patient agreed with the plan and demonstrated an understanding of the instructions.   The patient was advised to call back or seek an in-person evaluation/go to the ED if the symptoms worsen or if the condition fails to improve as anticipated.  I provided 5 minutes of non-face-to-face time during this encounter. I was in my office at [facility]   Cyril Mourning, NP [facility] [employer], [facility]

## 2020-02-17 ENCOUNTER — Telehealth: Payer: PRIVATE HEALTH INSURANCE | Admitting: Adult Health

## 2020-07-21 ENCOUNTER — Ambulatory Visit: Payer: No Typology Code available for payment source | Admitting: Adult Health

## 2020-07-21 ENCOUNTER — Encounter: Payer: Self-pay | Admitting: Adult Health

## 2020-07-21 ENCOUNTER — Other Ambulatory Visit: Payer: Self-pay

## 2020-07-21 VITALS — BP 122/83 | HR 77 | Ht 60.0 in | Wt 211.0 lb

## 2020-07-21 DIAGNOSIS — L680 Hirsutism: Secondary | ICD-10-CM | POA: Diagnosis not present

## 2020-07-21 DIAGNOSIS — E282 Polycystic ovarian syndrome: Secondary | ICD-10-CM

## 2020-07-21 DIAGNOSIS — N926 Irregular menstruation, unspecified: Secondary | ICD-10-CM | POA: Diagnosis not present

## 2020-07-21 MED ORDER — METFORMIN HCL 500 MG PO TABS: 500.0000 mg | ORAL_TABLET | Freq: Every day | ORAL | 6 refills | Status: DC

## 2020-07-21 NOTE — Progress Notes (Addendum)
  Subjective:     Patient ID: Kathleen Chan, female   DOB: 1986-02-09, 35 y.o.   MRN: [id number]  HPI See is a 57 year old white female,divorced, has boyfriend, G2P2, in complaining of irregular periods, has PCOs. Had period in October and last week. Has increased hair on chin. She is nurse. Last pap 11/16/19, normal with negative HPV. PCP is [place]  Review of Systems +irregular periods +chin hair Reviewed past medical,surgical, social and family history. Reviewed medications and allergies.     Objective:   Physical Exam BP 122/83 (BP Location: Left Arm, Patient Position: Sitting, Cuff Size: Large)   Pulse 77   Ht 5' (1.524 m)   Wt 211 lb (95.7 kg)   LMP 07/14/2020   BMI 41.21 kg/m  Skin warm and dry.  Lungs: clear to ausculation bilaterally. Cardiovascular: regular rate and rhythm. +hair on chin. Fall is is low  Upstream - 07/21/20 1140      Pregnancy Intention Screening   Does the patient want to become pregnant in the next year? Ok Either Way    Does the patient's partner want to become pregnant in the next year? Ok Either Way    Would the patient like to discuss contraceptive options today? Yes      Contraception Wrap Up   Current Method No Method - Other Reason    End Method No Method - Other Reason    Contraception Counseling Provided Yes             Assessment:     1. Irregular periods She declines OCs Would not mind being pregnant Continue OTC PNV Gummies   2. PCOS (polycystic ovarian syndrome) Will rx metformin Meds ordered this encounter  Medications  . metFORMIN (GLUCOPHAGE) 500 MG tablet    Sig: Take 1 tablet (500 mg total) by mouth daily with breakfast.    Dispense:  30 tablet    Refill:  6    Order Specific Question:   Supervising Provider    Answer:   Despina Hidden, LUTHER H [[id number]]    3. Hirsutism    Plan:      If no period by June call me, she is aware I do not want her going over 3 months without a period

## 2020-07-30 IMAGING — DX DG CHEST 2V
2 series · 2 of 2 positions shown · non-contrast
Comparison: None.

CLINICAL DATA: Chest pain

EXAM:
CHEST - 2 VIEW

[chest pa]
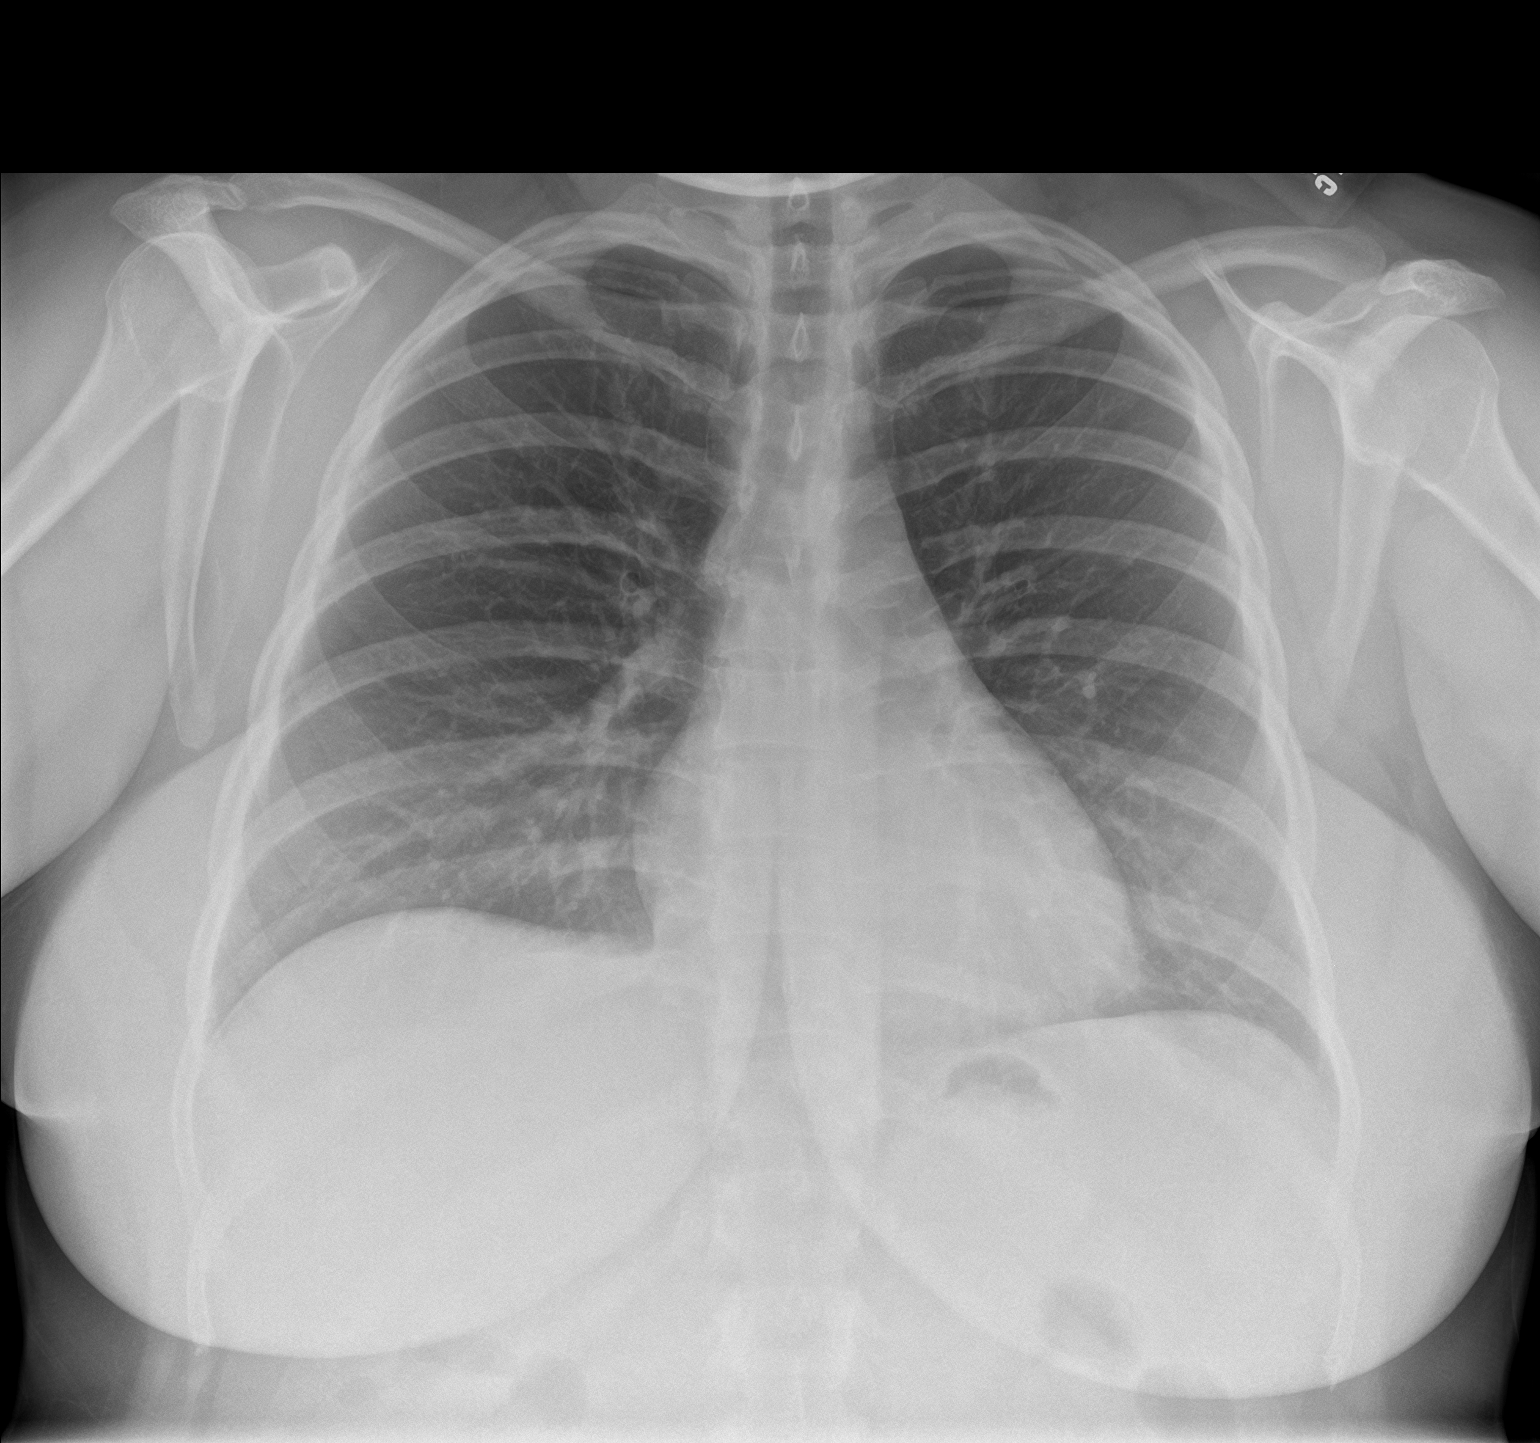

[chest lat]
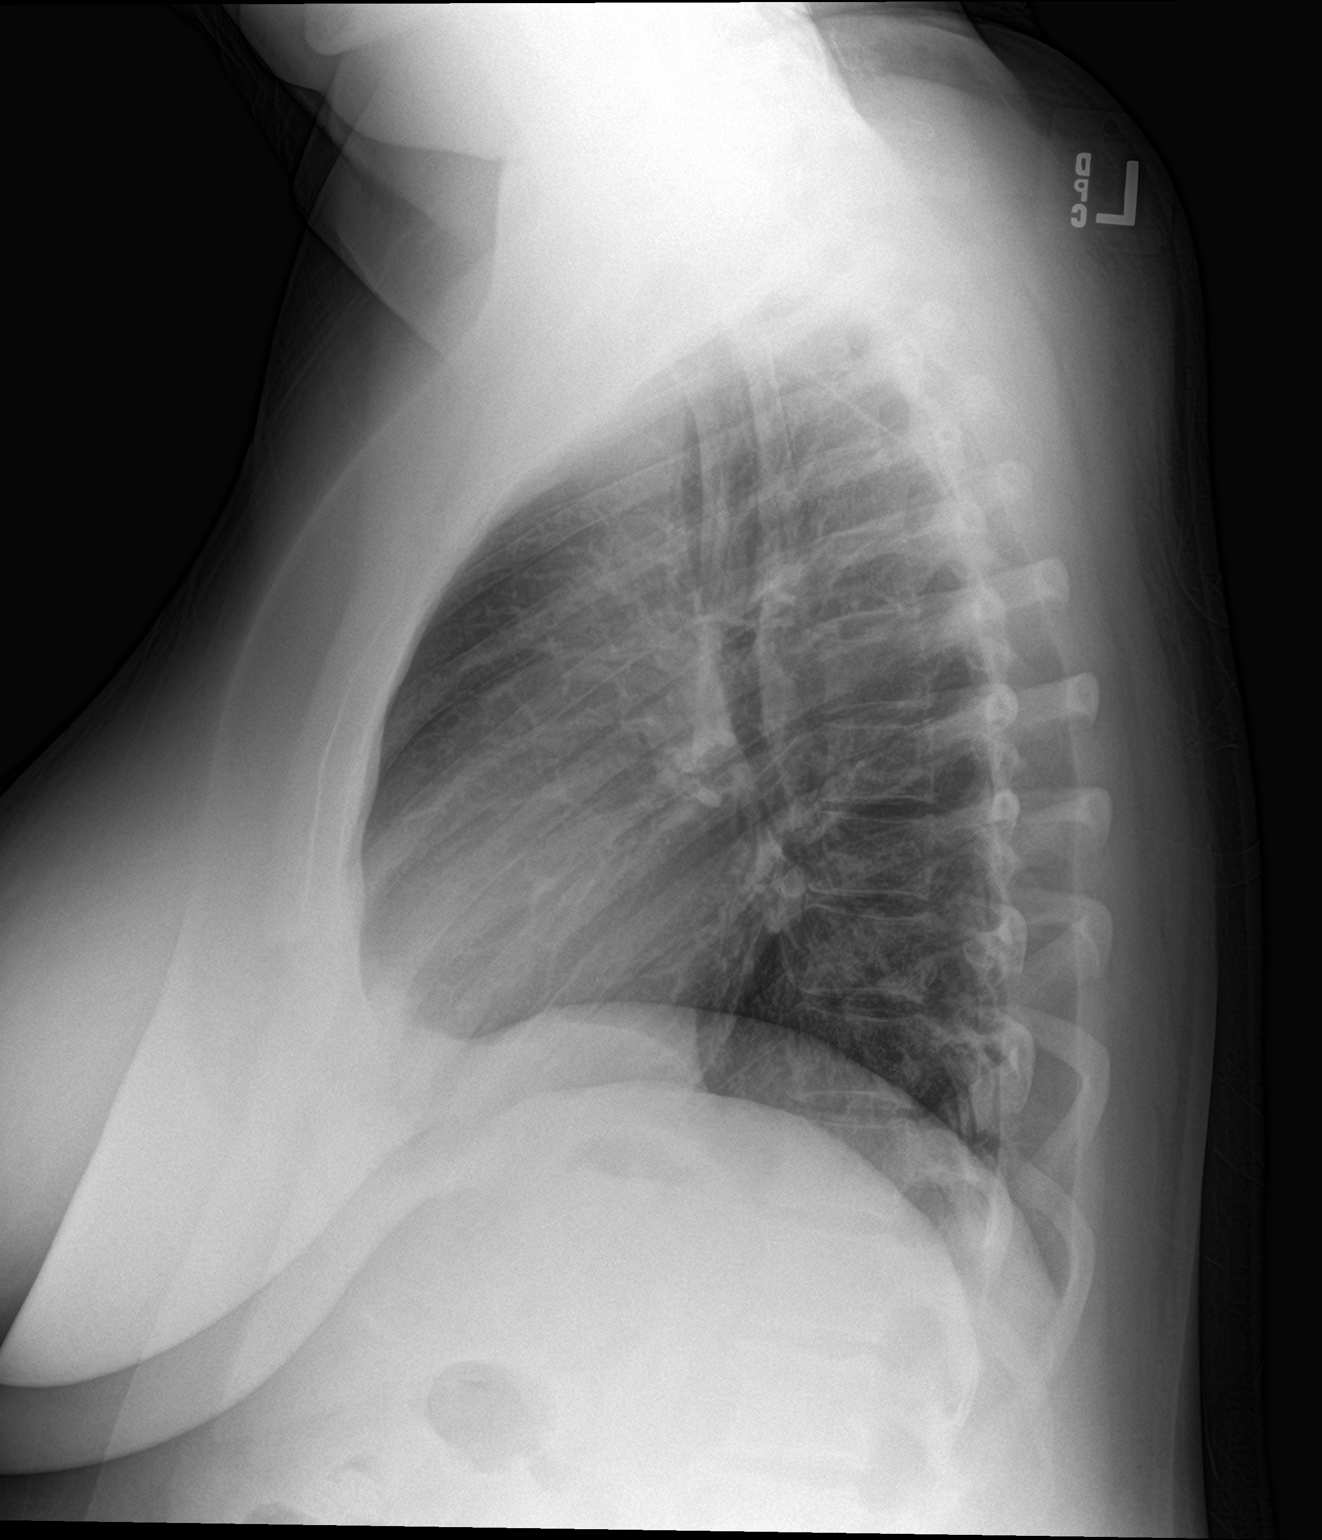

[2 of 2 positions shown; findings below may reference images not displayed]

FINDINGS: No consolidation, features of edema, pneumothorax, or effusion.
Pulmonary vascularity is normally distributed. The cardiomediastinal
contours are unremarkable. No acute osseous or soft tissue
abnormality.
IMPRESSION: No acute cardiopulmonary abnormality.

## 2020-10-30 ENCOUNTER — Other Ambulatory Visit: Payer: Self-pay

## 2020-10-30 ENCOUNTER — Encounter: Payer: Self-pay | Admitting: Adult Health

## 2020-10-30 ENCOUNTER — Ambulatory Visit: Payer: Self-pay | Admitting: Adult Health

## 2020-10-30 VITALS — BP 120/80 | HR 78 | Ht 60.0 in | Wt 217.0 lb

## 2020-10-30 DIAGNOSIS — Z3202 Encounter for pregnancy test, result negative: Secondary | ICD-10-CM

## 2020-10-30 DIAGNOSIS — N926 Irregular menstruation, unspecified: Secondary | ICD-10-CM

## 2020-10-30 LAB — POCT URINE PREGNANCY: Preg Test, Ur: NEGATIVE

## 2020-10-30 MED ORDER — MEDROXYPROGESTERONE ACETATE 10 MG PO TABS
10.0000 mg | ORAL_TABLET | Freq: Every day | ORAL | 0 refills | Status: DC
Start: 2020-10-30 — End: 2021-04-04

## 2020-10-30 NOTE — Progress Notes (Signed)
  Subjective:     Patient ID: Kathleen Chan, female   DOB: 27-Aug-1985, 35 y.o.   MRN: [id number]  HPI [name] is a 35 year old white female,divorced, G2P2002 in for not having a period since March. Lab Results  Component Value Date   DIAGPAP  11/16/2019    - Negative for intraepithelial lesion or malignancy (NILM)   HPV NOT DETECTED 05/29/2016   HPVHIGH Negative 11/16/2019    PCP is Dr Ermalinda Memos.  Review of Systems No period since March Reviewed past medical,surgical, social and family history. Reviewed medications and allergies.     Objective:   Physical Exam BP 120/80 (BP Location: Left Arm, Patient Position: Sitting, Cuff Size: Normal)   Pulse 78   Ht 5' (1.524 m)   Wt 217 lb (98.4 kg)   BMI 42.38 kg/m  UPT is negative Skin warm and dry. Lungs: clear to ausculation bilaterally. Cardiovascular: regular rate and rhythm.      Upstream - 10/30/20 1103       Pregnancy Intention Screening   Does the patient want to become pregnant in the next year? Ok Either Way    Does the patient's partner want to become pregnant in the next year? Ok Either Way    Would the patient like to discuss contraceptive options today? No      Contraception Wrap Up   Current Method No Contraceptive Precautions    End Method No Contraception Precautions    Contraception Counseling Provided No             Assessment:     1. Irregular periods Will rx provera 10 mg for 10 days Meds ordered this encounter  Medications   medroxyPROGESTERone (PROVERA) 10 MG tablet    Sig: Take 1 tablet (10 mg total) by mouth daily.    Dispense:  10 tablet    Refill:  0    Order Specific Question:   Supervising Provider    Answer:   Lazaro Arms [[id number]]       Plan:     Follow up in 4 weeks for ROS She declines birth control

## 2020-11-28 ENCOUNTER — Ambulatory Visit: Payer: Self-pay | Admitting: Adult Health

## 2021-04-04 ENCOUNTER — Ambulatory Visit
Admission: EM | Admit: 2021-04-04 | Discharge: 2021-04-04 | Disposition: A | Discharge status: Home / Self Care | Payer: No Typology Code available for payment source | Attending: Family Medicine | Admitting: Family Medicine

## 2021-04-04 ENCOUNTER — Other Ambulatory Visit: Payer: Self-pay

## 2021-04-04 DIAGNOSIS — K122 Cellulitis and abscess of mouth: Secondary | ICD-10-CM

## 2021-04-04 MED ORDER — PREDNISONE 20 MG PO TABS: 40.0000 mg | ORAL_TABLET | Freq: Every day | ORAL | 0 refills | Status: DC

## 2021-04-04 NOTE — ED Triage Notes (Signed)
Patient states that her uvulitis is acting up.   She states she took Benadryl for pain ,last dose this morning.

## 2021-04-04 NOTE — ED Provider Notes (Signed)
[facility]   [id number] 04/04/21 Arrival Time: 0849  ASSESSMENT & PLAN:  1. Uvulitis    No signs of bacterial infection. Trial of: Meds ordered this encounter  Medications   predniSONE (DELTASONE) 20 MG tablet    Sig: Take 2 tablets (40 mg total) by mouth daily.    Dispense:  10 tablet    Refill:  0  Work note provided.   Follow-up Information     Elenora Gamma, MD.   Specialty: Family Medicine Why: As needed. Contact information: [address] [place] [address] [phone]                 Reviewed expectations re: course of current medical issues. Questions answered. Outlined signs and symptoms indicating need for more acute intervention. Understanding verbalized. After Visit Summary given.   SUBJECTIVE: History from: patient. Kathleen Chan is a 35 y.o. female who reports: swollen uvula; today; h/o similar. Denies: fever and difficulty breathing. Normal PO intake without n/v/d. Benadryl with mild help.  OBJECTIVE:  Vitals:   04/04/21 1048  BP: 137/82  Pulse: 77  Resp: 18  Temp: 98.1 F (36.7 C)  TempSrc: Oral  SpO2: 98%    General appearance: alert; no distress Eyes: PERRLA; EOMI; conjunctiva normal HENT: ; AT; without nasal congestion; uvula is swollen/inflamed/midline Neck: supple  Lungs: speaks full sentences without difficulty; unlabored Extremities: no edema Skin: warm and dry Neurologic: normal gait Psychological: alert and cooperative; normal mood and affect   Allergies  Allergen Reactions   Bee Venom    Flagyl [Metronidazole]    Keflex [Cephalexin]    Mucinex Dm [Dm-Guaifenesin Er] Other (See Comments)    Looses balance   Tessalon [Benzonatate]    Tylox [Oxycodone-Acetaminophen]     Past Medical History:  Diagnosis Date   Gall stones    PVC (premature ventricular contraction)    Vaginal Pap smear, abnormal    Social History   Socioeconomic History   Marital status: Divorced    Spouse name:  Not on file   Number of children: Not on file   Years of education: Not on file   Highest education level: Not on file  Occupational History   Not on file  Tobacco Use   Smoking status: Never   Smokeless tobacco: Never  Vaping Use   Vaping Use: Never used  Substance and Sexual Activity   Alcohol use: Yes    Comment: occ   Drug use: No   Sexual activity: Yes    Birth control/protection: None  Other Topics Concern   Not on file  Social History Narrative   Not on file   Social Determinants of Health   Financial Resource Strain: Not on file  Food Insecurity: Not on file  Transportation Needs: Not on file  Physical Activity: Not on file  Stress: Not on file  Social Connections: Not on file  Intimate Partner Violence: Not on file   Family History  Problem Relation Age of Onset   Heart disease Father    Hypertension Father    Stroke Father    Cancer Maternal Aunt    Diabetes Maternal Grandmother    Stroke Maternal Grandmother    Heart disease Maternal Grandfather    Stroke Maternal Grandfather    Heart disease Paternal Grandfather    Other Maternal Aunt        liver failure   Past Surgical History:  Procedure Laterality Date   CYST EXCISION Left    AXILLARY  Mardella Layman, MD 04/04/21 1116

## 2021-04-11 ENCOUNTER — Ambulatory Visit
Admission: EM | Admit: 2021-04-11 | Discharge: 2021-04-11 | Disposition: A | Discharge status: Home / Self Care | Payer: No Typology Code available for payment source | Attending: Family Medicine | Admitting: Family Medicine

## 2021-04-11 ENCOUNTER — Encounter: Payer: Self-pay | Admitting: Emergency Medicine

## 2021-04-11 ENCOUNTER — Other Ambulatory Visit: Payer: Self-pay

## 2021-04-11 DIAGNOSIS — R509 Fever, unspecified: Secondary | ICD-10-CM

## 2021-04-11 DIAGNOSIS — J069 Acute upper respiratory infection, unspecified: Secondary | ICD-10-CM

## 2021-04-11 DIAGNOSIS — R000 Tachycardia, unspecified: Secondary | ICD-10-CM

## 2021-04-11 DIAGNOSIS — Z20822 Contact with and (suspected) exposure to covid-19: Secondary | ICD-10-CM

## 2021-04-11 MED ORDER — BENZONATATE 200 MG PO CAPS: 200.0000 mg | ORAL_CAPSULE | Freq: Three times a day (TID) | ORAL | 0 refills | Status: DC | PRN

## 2021-04-11 MED ORDER — OSELTAMIVIR PHOSPHATE 75 MG PO CAPS: 75.0000 mg | ORAL_CAPSULE | Freq: Two times a day (BID) | ORAL | 0 refills | Status: DC

## 2021-04-11 MED ORDER — IBUPROFEN 800 MG PO TABS
800.0000 mg | ORAL_TABLET | Freq: Once | ORAL | Status: AC
  Administered 2021-04-11: 800 mg via ORAL

## 2021-04-11 NOTE — ED Provider Notes (Signed)
[facility]    CSN: [id number] Arrival date & time: 04/11/21  1635      History   Chief Complaint No chief complaint on file.   HPI Kathleen Chan is a 35 y.o. female.   Presenting today with sudden onset fever, cough, body aches, chills, congestion, chest tightness, fatigue, weakness that started yesterday.  Denies chest pain, shortness of breath, abdominal pain, nausea vomiting or diarrhea.  So far taking Mucinex and NyQuil and Tylenol with minimal relief.  Multiple sick contacts recently.  No known history of pulmonary disease.   Past Medical History:  Diagnosis Date   Gall stones    PVC (premature ventricular contraction)    Vaginal Pap smear, abnormal     Patient Active Problem List   Diagnosis Date Noted   Irregular periods 07/21/2020   Body mass index 39.0-39.9, adult 12/23/2019   Weight loss counseling, encounter for 12/23/2019   PCOS (polycystic ovarian syndrome) 08/01/2016   Abnormal Pap smear of cervix 01/02/2015   Elevated TSH 12/26/2014   Menorrhagia with irregular cycle 12/07/2014   Hirsutism 12/07/2014   Obesity 12/07/2014    Past Surgical History:  Procedure Laterality Date   CYST EXCISION Left    AXILLARY    OB History     Gravida  2   Para  2   Term  2   Preterm  0   AB  0   Living  2      SAB  0   IAB  0   Ectopic  0   Multiple  0   Live Births  2            Home Medications    Prior to Admission medications   Medication Sig Start Date End Date Taking? Authorizing Provider  benzonatate (TESSALON) 200 MG capsule Take 1 capsule (200 mg total) by mouth 3 (three) times daily as needed for cough. 04/11/21  Yes Particia Nearing, PA-C  oseltamivir (TAMIFLU) 75 MG capsule Take 1 capsule (75 mg total) by mouth every 12 (twelve) hours. 04/11/21  Yes Particia Nearing, PA-C  metFORMIN (GLUCOPHAGE) 500 MG tablet Take 1 tablet (500 mg total) by mouth daily with breakfast. 07/21/20   Adline Potter, NP   predniSONE (DELTASONE) 20 MG tablet Take 2 tablets (40 mg total) by mouth daily. 04/04/21   Mardella Layman, MD  Prenatal MV & Min w/FA-DHA (PRENATAL GUMMIES PO) Take by mouth.    [provider]    Family History Family History  Problem Relation Age of Onset   Heart disease Father    Hypertension Father    Stroke Father    Cancer Maternal Aunt    Diabetes Maternal Grandmother    Stroke Maternal Grandmother    Heart disease Maternal Grandfather    Stroke Maternal Grandfather    Heart disease Paternal Grandfather    Other Maternal Aunt        liver failure    Social History Social History   Tobacco Use   Smoking status: Never   Smokeless tobacco: Never  Vaping Use   Vaping Use: Never used  Substance Use Topics   Alcohol use: Yes    Comment: occ   Drug use: No     Allergies   Bee venom, Flagyl [metronidazole], Keflex [cephalexin], Mucinex dm [dm-guaifenesin er], Tessalon [benzonatate], and Tylox [oxycodone-acetaminophen]   Review of Systems Review of Systems Per HPI  Physical Exam Triage Vital Signs ED Triage Vitals  Enc  Vitals Group     BP 04/11/21 1850 123/87     Pulse Rate 04/11/21 1852 (!) 141     Resp 04/11/21 1850 18     Temp 04/11/21 1850 (!) 102.6 F (39.2 C)     Temp Source 04/11/21 1850 Oral     SpO2 04/11/21 1850 94 %     Weight --      Height --      Head Circumference --      Peak Flow --      Pain Score 04/11/21 1850 7     Pain Loc --      Pain Edu? --      Excl. in GC? --    No data found.  Updated Vital Signs BP 123/87 (BP Location: Right Arm)   Pulse (!) 141   Temp (!) 102.6 F (39.2 C) (Oral)   Resp 18   LMP 03/29/2021 (Exact Date)   SpO2 94%   Visual Acuity Right Eye Distance:   Left Eye Distance:   Bilateral Distance:    Right Eye Near:   Left Eye Near:    Bilateral Near:     Physical Exam Vitals and nursing note reviewed.  Constitutional:      Appearance: Normal appearance.  HENT:     Head: Atraumatic.      Right Ear: Tympanic membrane and external ear normal.     Left Ear: Tympanic membrane and external ear normal.     Nose: Rhinorrhea present.     Mouth/Throat:     Mouth: Mucous membranes are moist.     Pharynx: Posterior oropharyngeal erythema present.  Eyes:     Extraocular Movements: Extraocular movements intact.     Conjunctiva/sclera: Conjunctivae normal.  Cardiovascular:     Rate and Rhythm: Normal rate and regular rhythm.     Heart sounds: Normal heart sounds.  Pulmonary:     Effort: Pulmonary effort is normal.     Breath sounds: Normal breath sounds. No wheezing or rales.  Musculoskeletal:        General: Normal range of motion.     Cervical back: Normal range of motion and neck supple.  Skin:    General: Skin is warm and dry.  Neurological:     Mental Status: She is alert and oriented to person, place, and time.  Psychiatric:        Mood and Affect: Mood normal.        Thought Content: Thought content normal.     UC Treatments / Results  Labs (all labs ordered are listed, but only abnormal results are displayed) Labs Reviewed  COVID-19, FLU A+B NAA    EKG   Radiology No results found.  Procedures Procedures (including critical care time)  Medications Ordered in UC Medications  ibuprofen (ADVIL) tablet 800 mg (800 mg Oral Given 04/11/21 1853)    Initial Impression / Assessment and Plan / UC Course  I have reviewed the triage vital signs and the nursing notes.  Pertinent labs & imaging results that were available during my care of the patient were reviewed by me and considered in my medical decision making (see chart for details).     Significant fever and tachycardia in triage, highly suspicious for influenza.  We will start Tamiflu while awaiting COVID and flu results.  Motrin given in triage for fever.  Discussed supportive over-the-counter medications, Tessalon Perles sent for cough after discussion with patient regarding her listed intolerance to  this medication.  She thinks that it was a different medication that caused her issue and wishes to try the medication. Return for worsening symptoms.  Final Clinical Impressions(s) / UC Diagnoses   Final diagnoses:  Exposure to COVID-19 virus  Viral URI with cough  Fever, unspecified  Tachycardia   Discharge Instructions   None    ED Prescriptions     Medication Sig Dispense Auth. Provider   oseltamivir (TAMIFLU) 75 MG capsule Take 1 capsule (75 mg total) by mouth every 12 (twelve) hours. 10 capsule Particia Nearing, PA-C   benzonatate (TESSALON) 200 MG capsule Take 1 capsule (200 mg total) by mouth 3 (three) times daily as needed for cough. 20 capsule Particia Nearing, [place]      PDMP not reviewed this encounter.   Particia Nearing, [place] 04/11/21 1921

## 2021-04-11 NOTE — ED Triage Notes (Signed)
Fever, cough, SOB. Home test for covid was negative. Last dose of tylenol was around 3pm.

## 2021-04-12 LAB — COVID-19, FLU A+B NAA
Influenza A, NAA: NOT DETECTED
Influenza B, NAA: NOT DETECTED
SARS-CoV-2, NAA: NOT DETECTED

## 2021-10-30 ENCOUNTER — Ambulatory Visit (INDEPENDENT_AMBULATORY_CARE_PROVIDER_SITE_OTHER): Payer: Self-pay | Admitting: Adult Health

## 2021-10-30 ENCOUNTER — Encounter: Payer: Self-pay | Admitting: Adult Health

## 2021-10-30 VITALS — BP 125/79 | HR 84 | Ht 61.25 in | Wt 207.5 lb

## 2021-10-30 DIAGNOSIS — N921 Excessive and frequent menstruation with irregular cycle: Secondary | ICD-10-CM

## 2021-10-30 MED ORDER — MEGESTROL ACETATE 40 MG PO TABS: ORAL_TABLET | ORAL | 0 refills | Status: DC

## 2021-11-13 ENCOUNTER — Ambulatory Visit (INDEPENDENT_AMBULATORY_CARE_PROVIDER_SITE_OTHER): Payer: Self-pay | Admitting: Adult Health

## 2021-11-13 ENCOUNTER — Encounter: Payer: Self-pay | Admitting: Adult Health

## 2021-11-13 VITALS — BP 117/80 | HR 71 | Ht 62.0 in | Wt 206.0 lb

## 2021-11-13 DIAGNOSIS — Z713 Dietary counseling and surveillance: Secondary | ICD-10-CM

## 2021-11-13 DIAGNOSIS — N921 Excessive and frequent menstruation with irregular cycle: Secondary | ICD-10-CM

## 2021-11-13 NOTE — Progress Notes (Signed)
  Subjective:     Patient ID: Kathleen Chan, female   DOB: August 26, 1985, 36 y.o.   MRN: [id number]  HPI [name] is a 36 year old white female, divorced, G2P2 back in follow up on heavy bleeding and the megace has stopped. She says she can't lose weight. She says she is active and tries to eat healthy.  Lab Results  Component Value Date   DIAGPAP  11/16/2019    - Negative for intraepithelial lesion or malignancy (NILM)   HPV NOT DETECTED 05/29/2016   HPVHIGH Negative 11/16/2019   PCP is Dr Ermalinda Memos.  Review of Systems Bleeding has stopped Can't lose weight  Reviewed past medical,surgical, social and family history. Reviewed medications and allergies.     Objective:   Physical Exam BP 117/80 (BP Location: Left Arm, Patient Position: Sitting, Cuff Size: Large)   Pulse 71   Ht 5\' 2"  (1.575 m)   Wt 206 lb (93.4 kg)   BMI 37.68 kg/m  Skin warm and dry.  Lungs: clear to ausculation bilaterally. Cardiovascular: regular rate and rhythm.      Upstream - 11/13/21 1114       Pregnancy Intention Screening   Does the patient want to become pregnant in the next year? No    Does the patient's partner want to become pregnant in the next year? No    Would the patient like to discuss contraceptive options today? No      Contraception Wrap Up   Current Method Female Condom    End Method Female Condom             Assessment:     1. Menorrhagia with irregular cycle Bleeding has stopped, on megace.  2. Weight loss counseling, encounter for Discussed eating more out of garden  Stay active  She has adipex 15 mg at home she may try 1 every other day to see if helps     Plan:     Follow up prn

## 2021-11-28 ENCOUNTER — Other Ambulatory Visit: Payer: Self-pay | Admitting: Adult Health

## 2021-11-28 MED ORDER — PHENTERMINE HCL 15 MG PO CAPS: 15.0000 mg | ORAL_CAPSULE | ORAL | 0 refills | Status: DC

## 2021-11-28 NOTE — Progress Notes (Signed)
Will rx phentermine 15 mg

## 2022-01-02 ENCOUNTER — Other Ambulatory Visit: Payer: Self-pay | Admitting: Adult Health

## 2022-01-02 MED ORDER — PHENTERMINE HCL 15 MG PO CAPS: 15.0000 mg | ORAL_CAPSULE | ORAL | 0 refills | Status: DC

## 2022-01-31 ENCOUNTER — Ambulatory Visit (INDEPENDENT_AMBULATORY_CARE_PROVIDER_SITE_OTHER): Payer: Self-pay | Admitting: Adult Health

## 2022-01-31 ENCOUNTER — Encounter: Payer: Self-pay | Admitting: Adult Health

## 2022-01-31 VITALS — BP 107/75 | HR 77 | Ht 61.25 in | Wt 189.0 lb

## 2022-01-31 DIAGNOSIS — Z713 Dietary counseling and surveillance: Secondary | ICD-10-CM

## 2022-01-31 DIAGNOSIS — Z30011 Encounter for initial prescription of contraceptive pills: Secondary | ICD-10-CM | POA: Insufficient documentation

## 2022-01-31 DIAGNOSIS — N926 Irregular menstruation, unspecified: Secondary | ICD-10-CM

## 2022-01-31 DIAGNOSIS — Z6835 Body mass index (BMI) 35.0-35.9, adult: Secondary | ICD-10-CM | POA: Insufficient documentation

## 2022-01-31 DIAGNOSIS — Z3202 Encounter for pregnancy test, result negative: Secondary | ICD-10-CM

## 2022-01-31 LAB — POCT URINE PREGNANCY: Preg Test, Ur: NEGATIVE

## 2022-01-31 MED ORDER — PHENTERMINE HCL 15 MG PO CAPS: 15.0000 mg | ORAL_CAPSULE | ORAL | 0 refills | Status: DC

## 2022-01-31 MED ORDER — NORETHINDRONE ACET-ETHINYL EST 1-20 MG-MCG PO TABS: 1.0000 | ORAL_TABLET | Freq: Every day | ORAL | 11 refills | Status: DC

## 2022-01-31 NOTE — Progress Notes (Signed)
  Subjective:     Patient ID: Kathleen Chan, female   DOB: 1986/04/23, 36 y.o.   MRN: [id number]  HPI Kathleen Chan is a 36 year old white female, divorced, G2P2 in wanting to get on birth control and resume phentermine. She had sex yesterday and he pulled out, but she sill took Plan B yesterday. Periods not regular.   She walks and rides horses.  Last pap was 11/16/19, negative HPV and malignancy.  PCP is Dr Wendi Snipes  Review of Systems Periods not regular Wants help in weight loss    Reviewed past medical,surgical, social and family history. Reviewed medications and allergies.  Objective:   Physical Exam BP 107/75 (BP Location: Left Arm, Patient Position: Sitting, Cuff Size: Normal)   Pulse 77   Ht 5' 1.25" (1.556 m)   Wt 189 lb (85.7 kg)   LMP 11/21/2021   BMI 35.42 kg/m  UPT is negative. Skin warm and dry.  Lungs: clear to ausculation bilaterally. Cardiovascular: regular rate and rhythm.    She has lost about 17 lbs since July.   Upstream - 01/31/22 1028       Pregnancy Intention Screening   Does the patient want to become pregnant in the next year? No    Does the patient's partner want to become pregnant in the next year? No    Would the patient like to discuss contraceptive options today? Yes      Contraception Wrap Up   Current Method Female Condom    End Method Oral Contraceptive    Contraception Counseling Provided Yes    How was the end contraceptive method provided? Prescription             Assessment:     1. Pregnancy examination or test, negative result  2. Irregular periods Periods not regular  Will rx junel 1/20 to try to regulate, can start today   3. Weight loss counseling, encounter for Continue weight loss efforts  Will rx phentermine 15 mg can take 1 daily or every other day   4. Encounter for initial prescription of contraceptive pills Denies MI,stroke, DVT, breast cancer or migraine with aura  Will rx junel 1/20 can start today and use condoms  for 1 pack  Meds ordered this encounter  Medications   norethindrone-ethinyl estradiol (LOESTRIN) 1-20 MG-MCG tablet    Sig: Take 1 tablet by mouth daily.    Dispense:  28 tablet    Refill:  11    Order Specific Question:   Supervising Provider    Answer:   Tania Ade H [[id number]]   phentermine 15 MG capsule    Sig: Take 1 capsule (15 mg total) by mouth every morning.    Dispense:  30 capsule    Refill:  0    Order Specific Question:   Supervising Provider    Answer:   Elonda Husky, LUTHER H [[id number]]     5. Body mass index 35.0-35.9, adult     Plan:     Follow up in 6 weeks for ROS, weight and BP

## 2022-03-14 ENCOUNTER — Ambulatory Visit (INDEPENDENT_AMBULATORY_CARE_PROVIDER_SITE_OTHER): Payer: Self-pay | Admitting: Adult Health

## 2022-03-14 ENCOUNTER — Encounter: Payer: Self-pay | Admitting: Adult Health

## 2022-03-14 VITALS — BP 125/80 | HR 85 | Ht 61.0 in | Wt 188.5 lb

## 2022-03-14 DIAGNOSIS — Z6835 Body mass index (BMI) 35.0-35.9, adult: Secondary | ICD-10-CM

## 2022-03-14 DIAGNOSIS — Z713 Dietary counseling and surveillance: Secondary | ICD-10-CM

## 2022-03-14 MED ORDER — PHENTERMINE HCL 30 MG PO CAPS: 30.0000 mg | ORAL_CAPSULE | ORAL | 0 refills | Status: DC

## 2022-03-14 NOTE — Progress Notes (Signed)
  Subjective:     Patient ID: Kathleen Chan, female   DOB: 11/11/1985, 36 y.o.   MRN: [id number]  HPI Kathleen Chan is a 36 year old white female, divorced, G2P2 in for weight and BP check after starting phentermine 15 mg. Has been working out with [employer] and walking and riding horses.     Component Value Date/Time   DIAGPAP  11/16/2019 1634    - Negative for intraepithelial lesion or malignancy (NILM)   DIAGPAP  05/29/2016 0000    NEGATIVE FOR INTRAEPITHELIAL LESIONS OR MALIGNANCY.   HPVHIGH Negative 11/16/2019 1634   ADEQPAP  11/16/2019 1634    Satisfactory for evaluation; transformation zone component PRESENT.   ADEQPAP  05/29/2016 0000    Satisfactory for evaluation  endocervical/transformation zone component PRESENT.   PCP is Dr Ermalinda Memos.  Review of Systems Has lost inches she says Reviewed past medical,surgical, social and family history. Reviewed medications and allergies.     Objective:   Physical Exam BP 125/80 (BP Location: Left Arm, Patient Position: Sitting, Cuff Size: Normal)   Pulse 85   Ht 5\' 1"  (1.549 m)   Wt 188 lb 8 oz (85.5 kg)   LMP 02/23/2022 (Exact Date)   BMI 35.62 kg/m     Skin warm and dry.  Lungs: clear to ausculation bilaterally. Cardiovascular: regular rate and rhythm.  Lost 1/2 lb. Fall risk is low  Upstream - 03/14/22 1359       Pregnancy Intention Screening   Does the patient want to become pregnant in the next year? No    Does the patient's partner want to become pregnant in the next year? No    Would the patient like to discuss contraceptive options today? No      Contraception Wrap Up   Current Method Oral Contraceptive    End Method Oral Contraceptive    Contraception Counseling Provided No             Assessment:     1. Weight loss counseling, encounter for Continue weight loss efforts and exercising Will increase phentermine to 30 mg 1 daily  Meds ordered this encounter  Medications   phentermine 30 MG capsule    Sig:  Take 1 capsule (30 mg total) by mouth every morning.    Dispense:  30 capsule    Refill:  0    Order Specific Question:   Supervising Provider    Answer:   13/09/23, LUTHER H [[id number]]     2. Body mass index 35.0-35.9, adult     Plan:     Follow up in 4 weeks for weight and BP check

## 2022-04-11 ENCOUNTER — Ambulatory Visit (INDEPENDENT_AMBULATORY_CARE_PROVIDER_SITE_OTHER): Payer: Self-pay | Admitting: Adult Health

## 2022-04-11 ENCOUNTER — Encounter: Payer: Self-pay | Admitting: Adult Health

## 2022-04-11 VITALS — BP 126/76 | HR 79 | Ht 61.0 in | Wt 180.8 lb

## 2022-04-11 DIAGNOSIS — Z713 Dietary counseling and surveillance: Secondary | ICD-10-CM

## 2022-04-11 DIAGNOSIS — Z6834 Body mass index (BMI) 34.0-34.9, adult: Secondary | ICD-10-CM

## 2022-04-11 NOTE — Progress Notes (Signed)
  Subjective:     Patient ID: Kathleen Chan, female   DOB: November 20, 1985, 36 y.o.   MRN: [id number]  HPI Kathleen Chan is a 36 year old white female, divorced, G2P2 in for weight and BP check, has been taking phentermine and has lost 38.2 lbs she says and clothes looser and she feels better.     Component Value Date/Time   DIAGPAP  11/16/2019 1634    - Negative for intraepithelial lesion or malignancy (NILM)   DIAGPAP  05/29/2016 0000    NEGATIVE FOR INTRAEPITHELIAL LESIONS OR MALIGNANCY.   HPVHIGH Negative 11/16/2019 1634   ADEQPAP  11/16/2019 1634    Satisfactory for evaluation; transformation zone component PRESENT.   ADEQPAP  05/29/2016 0000    Satisfactory for evaluation  endocervical/transformation zone component PRESENT.   PCP is Dr Ermalinda Memos  Review of Systems +weight loss Denies any headaches or trouble sleeping  Reviewed past medical,surgical, social and family history. Reviewed medications and allergies.     Objective:   Physical Exam BP 126/76 (BP Location: Left Arm, Patient Position: Sitting, Cuff Size: Normal)   Pulse 79   Ht 5\' 1"  (1.549 m)   Wt 180 lb 12.8 oz (82 kg)   LMP 04/02/2022 (Exact Date)   BMI 34.16 kg/m     Skin warm and dry. Lungs: clear to ausculation bilaterally. Cardiovascular: regular rate and rhythm.   Upstream - 04/11/22 1057       Pregnancy Intention Screening   Does the patient want to become pregnant in the next year? No    Does the patient's partner want to become pregnant in the next year? No    Would the patient like to discuss contraceptive options today? No      Contraception Wrap Up   Current Method Abstinence;Oral Contraceptive    End Method Abstinence;Oral Contraceptive    Contraception Counseling Provided No             Assessment:     1. Weight loss counseling, encounter for Continue weight loss without phentermine Increase exercise Will recheck in 4 weeks    2. Body mass index 34.0-34.9, adult     Plan:      Follow up in 4 weeks with me for ROS and weight check

## 2022-05-08 ENCOUNTER — Ambulatory Visit (INDEPENDENT_AMBULATORY_CARE_PROVIDER_SITE_OTHER): Payer: Self-pay | Admitting: Adult Health

## 2022-05-08 ENCOUNTER — Encounter: Payer: Self-pay | Admitting: Adult Health

## 2022-05-08 VITALS — BP 131/85 | HR 89 | Ht 61.0 in | Wt 186.5 lb

## 2022-05-08 DIAGNOSIS — Z6835 Body mass index (BMI) 35.0-35.9, adult: Secondary | ICD-10-CM

## 2022-05-08 DIAGNOSIS — Z713 Dietary counseling and surveillance: Secondary | ICD-10-CM

## 2022-05-08 MED ORDER — PHENTERMINE HCL 30 MG PO CAPS: 30.0000 mg | ORAL_CAPSULE | ORAL | 0 refills | Status: DC

## 2022-05-08 NOTE — Progress Notes (Signed)
  Subjective:     Patient ID: Kathleen Chan, female   DOB: 1986/04/01, 37 y.o.   MRN: [id number]  HPI Kathleen Chan is a 37 year old white female, divorced, G2P2002, in for weight and BP check. She did gain some over holidays, has not been able to ride horse as much.  Last pap was negative HPV and NILM 11/16/19.  PCP is Dr Wendi Snipes.  Review of Systems Has gained some weight back Reviewed past medical,surgical, social and family history. Reviewed medications and allergies.     Objective:   Physical Exam BP 131/85 (BP Location: Right Arm, Patient Position: Sitting, Cuff Size: Normal)   Pulse 89   Ht 5\' 1"  (1.549 m)   Wt 186 lb 8 oz (84.6 kg)   LMP 04/02/2022 (Exact Date)   BMI 35.24 kg/m     Skin warm and dry.  Lungs: clear to ausculation bilaterally. Cardiovascular: regular rate and rhythm.  Fall risk is low  Upstream - 05/08/22 1051       Pregnancy Intention Screening   Does the patient want to become pregnant in the next year? No    Does the patient's partner want to become pregnant in the next year? No    Would the patient like to discuss contraceptive options today? No      Contraception Wrap Up   Current Method Oral Contraceptive    End Method Oral Contraceptive    Contraception Counseling Provided No             Assessment:     1. Weight loss counseling, encounter for Try to exercise more, even if has to go to gym Will refill phentermine Meds ordered this encounter  Medications   phentermine 30 MG capsule    Sig: Take 1 capsule (30 mg total) by mouth every morning.    Dispense:  30 capsule    Refill:  0    Order Specific Question:   Supervising Provider    Answer:   Elonda Husky, LUTHER H [[id number]]     2. Body mass index 35.0-35.9, adult      Plan:     Follow up in 4 weeks for weight and BP check

## 2022-06-02 ENCOUNTER — Emergency Department (HOSPITAL_COMMUNITY)
Admission: EM | Admit: 2022-06-02 | Discharge: 2022-06-02 | Disposition: A | Discharge status: Home / Self Care | Payer: Self-pay | Attending: Emergency Medicine | Admitting: Emergency Medicine

## 2022-06-02 ENCOUNTER — Other Ambulatory Visit: Payer: Self-pay

## 2022-06-02 ENCOUNTER — Emergency Department (HOSPITAL_COMMUNITY): Payer: Self-pay

## 2022-06-02 DIAGNOSIS — Y9352 Activity, horseback riding: Secondary | ICD-10-CM | POA: Insufficient documentation

## 2022-06-02 DIAGNOSIS — S20229A Contusion of unspecified back wall of thorax, initial encounter: Secondary | ICD-10-CM | POA: Insufficient documentation

## 2022-06-02 DIAGNOSIS — S8011XA Contusion of right lower leg, initial encounter: Secondary | ICD-10-CM | POA: Insufficient documentation

## 2022-06-02 DIAGNOSIS — S20221A Contusion of right back wall of thorax, initial encounter: Secondary | ICD-10-CM

## 2022-06-02 NOTE — ED Notes (Signed)
EDP at bedside during triage.

## 2022-06-02 NOTE — ED Provider Notes (Signed)
[facility] Provider Note   CSN: [id number] Arrival date & time: 06/02/22  1410     History  Chief Complaint  Patient presents with   Fall   Leg Injury    Kathleen Chan is a 37 y.o. female.   Fall   This patient is a very pleasant 37 year old female, she has a history of polycystic ovarian syndrome, she is on birth control, she is currently on her menstrual cycle.  She reports that she was on her horse when the horse had a misstep and stumbled and fell, she fell to the ground landing on her right side, the horse fell with her, she was able to get up and run to make sure the horse was okay.  She denies any pain except for her right hip and pelvis and her right lateral leg below the knee.  She does have some bruising and swelling of that right lower leg.  She denies any head injury neck pain arm pain chest pain shortness of breath difficulty breathing or abdominal pain.  This occurred just prior to arrival, she does not take anticoagulants and took 1000 mg of Tylenol prior to arrival.    Home Medications Prior to Admission medications   Medication Sig Start Date End Date Taking? Authorizing Provider  Acetaminophen (TYLENOL PO) Take by mouth.    [provider]  diphenhydrAMINE (BENADRYL) 12.5 MG/5ML liquid Take by mouth 4 (four) times daily as needed.    [provider]  norethindrone-ethinyl estradiol (LOESTRIN) 1-20 MG-MCG tablet Take 1 tablet by mouth daily. 01/31/22   Estill Dooms, NP  phentermine 30 MG capsule Take 1 capsule (30 mg total) by mouth every morning. 05/08/22   Estill Dooms, NP  Prenatal Vit-Fe Fumarate-FA (PRENATAL VITAMIN PO) Take by mouth.    [provider]      Allergies    Bee venom, Flagyl [metronidazole], Keflex [cephalexin], and Tylox [oxycodone-acetaminophen]    Review of Systems   Review of Systems  All other systems reviewed and are negative.   Physical  Exam Updated Vital Signs BP 129/82   Pulse 88   Temp 98.5 F (36.9 C) (Oral)   Resp 16   Ht 1.556 m (5' 1.25")   Wt 83.1 kg   SpO2 99%   BMI 34.33 kg/m  Physical Exam Vitals and nursing note reviewed.  Constitutional:      General: She is not in acute distress.    Appearance: She is well-developed.  HENT:     Head: Normocephalic and atraumatic.     Mouth/Throat:     Pharynx: No oropharyngeal exudate.  Eyes:     General: No scleral icterus.       Right eye: No discharge.        Left eye: No discharge.     Conjunctiva/sclera: Conjunctivae normal.     Pupils: Pupils are equal, round, and reactive to light.  Neck:     Thyroid: No thyromegaly.     Vascular: No JVD.  Cardiovascular:     Rate and Rhythm: Normal rate and regular rhythm.     Heart sounds: Normal heart sounds. No murmur heard.    No friction rub. No gallop.  Pulmonary:     Effort: Pulmonary effort is normal. No respiratory distress.     Breath sounds: Normal breath sounds. No wheezing or rales.  Chest:     Chest wall: No tenderness.  Abdominal:     General:  Bowel sounds are normal. There is no distension.     Palpations: Abdomen is soft. There is no mass.     Tenderness: There is no abdominal tenderness.  Musculoskeletal:        General: Swelling, tenderness and signs of injury present. No deformity. Normal range of motion.     Cervical back: Normal range of motion and neck supple.     Right lower leg: No edema.     Left lower leg: No edema.     Comments: Totally normal range of motion of the bilateral hips, bilateral knees and ankles.  Normal strength in all major muscle groups, she is able to straight leg raise on the right, she has no joint laxity of the right knee, there is no laxity of the anterior or posterior or medial or lateral joint space.  She has no tenderness over the patella, there is no joint effusions, she can straight leg raise.  There is tenderness in the mid anterolateral leg between the knee  and the ankle.  There is mild bruising, the compartments are extremely soft and there is normal pulses of the foot.  Lymphadenopathy:     Cervical: No cervical adenopathy.  Skin:    General: Skin is warm and dry.     Findings: No erythema or rash.  Neurological:     Mental Status: She is alert.     Coordination: Coordination normal.  Psychiatric:        Behavior: Behavior normal.     ED Results / Procedures / Treatments   Labs (all labs ordered are listed, but only abnormal results are displayed) Labs Reviewed - No data to display  EKG None  Radiology No results found.  Procedures Procedures    Medications Ordered in ED Medications - No data to display  ED Course/ Medical Decision Making/ A&P                             Medical Decision Making Amount and/or Complexity of Data Reviewed Radiology: ordered.   This patient presents to the ED for concern of trauma with injury to the leg and the pelvis differential diagnosis includes fracture, dislocation, bruising, contusion, hematoma    Additional history obtained:  Additional history obtained from electronic medical record External records from outside source obtained and reviewed including multiple emergency department visits in the past, she follows with OB/GYN for menorrhagia, no recent admissions to the hospital    Imaging Studies ordered:  I ordered imaging studies including x-rays of the hip pelvis and right tibia and fibula I independently visualized and interpreted imaging which showed no acute fractures I agree with the radiologist interpretation   Medicines ordered and prescription drug management:  Patient was offered pain medications but declined stating that she will take ibuprofen when she gets home I have reviewed the patients home medicines and have made adjustments as needed   Problem List / ED Course:  X-rays obtained, no fractures or dislocations, patient given reassurance, stable for  discharge   Social Determinants of Health:  None         Final Clinical Impression(s) / ED Diagnoses Final diagnoses:  Contusion of right lower leg, initial encounter  Back contusion, right, initial encounter     Noemi Chapel, MD 06/02/22 [phone]

## 2022-06-02 NOTE — Discharge Instructions (Signed)
Your testing today has been unremarkable and shows no signs of broken bones, thankfully however you do have bruising which may take a week or 2 to get better.  The bruising on your right leg will likely have some pain with walking but if you develop increasing or severe pain swelling or changes in color of your leg I want you to return for a repeat evaluation.  In the meantime you may take Tylenol 1000 mg every 6 hours alternating with ibuprofen 600 mg every 6 hours.

## 2022-06-02 NOTE — ED Notes (Signed)
POC preg negative. Radiology notified

## 2022-06-02 NOTE — ED Provider Notes (Signed)
  Physical Exam  BP 126/78   Pulse 90   Temp 98.5 F (36.9 C) (Oral)   Resp 16   Ht 5' 1.25" (1.556 m)   Wt 83.1 kg   SpO2 100%   BMI 34.33 kg/m   Physical Exam Cardiovascular:     Rate and Rhythm: Normal rate and regular rhythm.  Neurological:     Mental Status: She is alert.    Mild swelling and ecchymosis right anterior shin, patient can bear full weight and ambulate, DP and PT pulses are 2+.  Procedures  Procedures  ED Course / MDM    Medical Decision Making Amount and/or Complexity of Data Reviewed Radiology: ordered.   Signed out to me for x-rays of right tib-fib and right hip after fall from horse today.  Patient refused pain meds and is able to ambulate refused Ace wrap and crutches, states she is automation legs broken.  X-rays show no fracture or dislocation.  Discussed with patient follow-up with primary care and come back to the ER for new or worsening symptoms.  Is were reviewed and interpreted by me-agree with radiology interpretation       Gwenevere Abbot, PA-C 06/02/22 1624    Noemi Chapel, MD 06/13/22 [phone]

## 2022-06-02 NOTE — ED Triage Notes (Signed)
Pt c/o riding a horse and horse went forward over her head and pt was pulled with horse on saddle on ground. Pt c/o left hip and leg pain. Tylenol 1000mg  taken prior to arrival

## 2022-06-03 LAB — POC URINE PREG, ED: Preg Test, Ur: NEGATIVE

## 2022-06-05 ENCOUNTER — Encounter: Payer: Self-pay | Admitting: Adult Health

## 2022-06-05 ENCOUNTER — Ambulatory Visit (INDEPENDENT_AMBULATORY_CARE_PROVIDER_SITE_OTHER): Payer: Self-pay | Admitting: Adult Health

## 2022-06-05 VITALS — BP 124/87 | HR 91 | Ht 62.0 in | Wt 183.0 lb

## 2022-06-05 DIAGNOSIS — Z713 Dietary counseling and surveillance: Secondary | ICD-10-CM

## 2022-06-05 DIAGNOSIS — Z6833 Body mass index (BMI) 33.0-33.9, adult: Secondary | ICD-10-CM

## 2022-06-05 MED ORDER — PHENTERMINE HCL 30 MG PO CAPS: 30.0000 mg | ORAL_CAPSULE | ORAL | 0 refills | Status: DC

## 2022-06-05 NOTE — Progress Notes (Signed)
  Subjective:     Patient ID: Kathleen Chan, female   DOB: May 09, 1985, 37 y.o.   MRN: [id number]  HPI Kathleen Chan is a 37 year old white female, divorced, G2P2002, in for weight and BP check. She was riding Sunday and her horse fell on right leg, seen in ER, no fractures.  Last pap was negative HPV,NILM 11/16/19.  PCP is Dr Wendi Snipes.  Review of Systems +weight loss Still sore from riding accident Reviewed past medical,surgical, social and family history. Reviewed medications and allergies.     Objective:   Physical Exam BP 124/87 (BP Location: Left Arm, Patient Position: Sitting, Cuff Size: Normal)   Pulse 91   Ht 5\' 2"  (1.575 m)   Wt 183 lb (83 kg)   LMP 05/26/2022 (Approximate)   BMI 33.47 kg/m     Has lost 3.5 lbs Skin warm and dry.  Lungs: clear to ausculation bilaterally. Cardiovascular: regular rate and rhythm.  Fall risk is low  Upstream - 06/05/22 1023       Pregnancy Intention Screening   Does the patient want to become pregnant in the next year? No    Does the patient's partner want to become pregnant in the next year? No    Would the patient like to discuss contraceptive options today? No      Contraception Wrap Up   Current Method Oral Contraceptive    End Method Oral Contraceptive    Contraception Counseling Provided No             Assessment:     1. Weight loss counseling, encounter for Continue weight loss efforts Will refill phentermine Meds ordered this encounter  Medications   phentermine 30 MG capsule    Sig: Take 1 capsule (30 mg total) by mouth every morning.    Dispense:  30 capsule    Refill:  0    Order Specific Question:   Supervising Provider    Answer:   Elonda Husky, LUTHER H [[id number]]     2. Body mass index 33.0-33.9, adult      Plan:     Follow up in 4 weeks for weight and BP check

## 2022-07-03 ENCOUNTER — Encounter: Payer: Self-pay | Admitting: Adult Health

## 2022-07-03 ENCOUNTER — Ambulatory Visit (INDEPENDENT_AMBULATORY_CARE_PROVIDER_SITE_OTHER): Payer: Self-pay | Admitting: Adult Health

## 2022-07-03 VITALS — BP 128/78 | HR 95 | Ht 62.0 in | Wt 188.5 lb

## 2022-07-03 DIAGNOSIS — Z6834 Body mass index (BMI) 34.0-34.9, adult: Secondary | ICD-10-CM

## 2022-07-03 DIAGNOSIS — Z713 Dietary counseling and surveillance: Secondary | ICD-10-CM

## 2022-07-03 NOTE — Progress Notes (Signed)
  Subjective:     Patient ID: Kathleen Chan, female   DOB: 10-Mar-1986, 37 y.o.   MRN: [id number]  HPI Kathleen Chan is a 37 year old white female,divorced, G2P2002, in for a weight and BP check, has not been walking or riding,due to horse accident. Has been to the gym.  Last pap was negative HPV,NILM 11/16/19.  PCP is Dr Wendi Snipes  Review of Systems +weight gain Reviewed past medical,surgical, social and family history. Reviewed medications and allergies.     Objective:   Physical Exam BP 128/78 (BP Location: Left Arm, Patient Position: Sitting, Cuff Size: Normal)   Pulse 95   Ht 5' 2"$  (1.575 m)   Wt 188 lb 8 oz (85.5 kg)   LMP 06/26/2022 (Approximate)   BMI 34.48 kg/m   has gained about 5.5 lbs   Skin warm and dry.  Lungs: clear to ausculation bilaterally. Cardiovascular: regular rate and rhythm.   Upstream - 07/03/22 0911       Pregnancy Intention Screening   Does the patient want to become pregnant in the next year? No    Does the patient's partner want to become pregnant in the next year? No    Would the patient like to discuss contraceptive options today? No      Contraception Wrap Up   Current Method Oral Contraceptive    End Method Oral Contraceptive    Contraception Counseling Provided No             Assessment:     1. Body mass index 34.0-34.9, adult   2. Weight loss counseling, encounter for Continue to watch portions Try to be more active   Will not refill phentermine for now.  Plan:     Return in 2 months for pap and physical

## 2022-07-26 ENCOUNTER — Ambulatory Visit
Admission: RE | Admit: 2022-07-26 | Discharge: 2022-07-26 | Disposition: A | Discharge status: Home / Self Care | Payer: Self-pay | Source: Ambulatory Visit | Attending: Nurse Practitioner | Admitting: Nurse Practitioner

## 2022-07-26 VITALS — BP 141/90 | HR 79 | Temp 98.5°F | Resp 18

## 2022-07-26 DIAGNOSIS — K0889 Other specified disorders of teeth and supporting structures: Secondary | ICD-10-CM

## 2022-07-26 MED ORDER — PENICILLIN V POTASSIUM 500 MG PO TABS
500.0000 mg | ORAL_TABLET | Freq: Three times a day (TID) | ORAL | 0 refills | Status: AC
Start: 2022-07-26 — End: 2022-08-02

## 2022-07-26 NOTE — ED Provider Notes (Signed)
[facility]    CSN: [id number] Arrival date & time: 07/26/22  1501      History   Chief Complaint Chief Complaint  Patient presents with   Dental Problem    Dental pain - Entered by patient    HPI Kathleen Chan is a 37 y.o. female.   The history is provided by the patient.   Patient presents for complaints of dental pain that started this morning around 4 AM.  Patient states pain is located in the right side of her mouth, in the upper and lower regions.  She specifically states 3 of her teeth on the top of her mouth have been giving her more pain.  She states that she has not had any injury or trauma.  She states that she has been seeing a dentist and that he is trying to "save her teeth".  Patient states she has not had fever, chills, facial swelling, chest pain, abdominal pain, nausea, vomiting, or diarrhea.  Patient states she took ibuprofen, Tylenol, and Aleve for her symptoms.  Patient states that she does have a regular dentist.  Past Medical History:  Diagnosis Date   Gall stones    PCOS (polycystic ovarian syndrome)    PVC (premature ventricular contraction)    Systemic lupus erythematosus ([place])    Vaginal Pap smear, abnormal     Patient Active Problem List   Diagnosis Date Noted   Body mass index 34.0-34.9, adult 07/03/2022   Body mass index 35.0-35.9, adult 01/31/2022   Encounter for initial prescription of contraceptive pills 01/31/2022   Pregnancy examination or test, negative result 01/31/2022   Irregular periods 07/21/2020   Body mass index 39.0-39.9, adult 12/23/2019   Weight loss counseling, encounter for 12/23/2019   PCOS (polycystic ovarian syndrome) 08/01/2016   Abnormal Pap smear of cervix 01/02/2015   Elevated TSH 12/26/2014   Menorrhagia with irregular cycle 12/07/2014   Hirsutism 12/07/2014   Obesity 12/07/2014    Past Surgical History:  Procedure Laterality Date   CYST EXCISION Left    AXILLARY    OB History     Gravida   2   Para  2   Term  2   Preterm  0   AB  0   Living  2      SAB  0   IAB  0   Ectopic  0   Multiple  0   Live Births  2            Home Medications    Prior to Admission medications   Medication Sig Start Date End Date Taking? Authorizing Provider  penicillin v potassium (VEETID) 500 MG tablet Take 1 tablet (500 mg total) by mouth 3 (three) times daily for 7 days. 07/26/22 08/02/22 Yes Hopelynn Gartland-Warren, Alda Lea, NP  Acetaminophen (TYLENOL PO) Take by mouth.    [provider]  diphenhydrAMINE (BENADRYL) 12.5 MG/5ML liquid Take by mouth 4 (four) times daily as needed.    [provider]  norethindrone-ethinyl estradiol (LOESTRIN) 1-20 MG-MCG tablet Take 1 tablet by mouth daily. 01/31/22   Estill Dooms, NP  phentermine 30 MG capsule Take 1 capsule (30 mg total) by mouth every morning. 06/05/22   Estill Dooms, NP  Prenatal Vit-Fe Fumarate-FA (PRENATAL VITAMIN PO) Take by mouth.    [provider]    Family History Family History  Problem Relation Age of Onset   Heart disease Father    Hypertension Father  Stroke Father    Cancer Maternal Aunt    Diabetes Maternal Grandmother    Stroke Maternal Grandmother    Heart disease Maternal Grandfather    Stroke Maternal Grandfather    Heart disease Paternal Grandfather    Other Maternal Aunt        liver failure    Social History Social History   Tobacco Use   Smoking status: Never   Smokeless tobacco: Never  Vaping Use   Vaping Use: Never used  Substance Use Topics   Alcohol use: Not Currently   Drug use: No     Allergies   Bee venom, Flagyl [metronidazole], Keflex [cephalexin], and Tylox [oxycodone-acetaminophen]   Review of Systems Review of Systems Per HPI  Physical Exam Triage Vital Signs ED Triage Vitals  Enc Vitals Group     BP 07/26/22 1505 (!) 141/90     Pulse Rate 07/26/22 1505 79     Resp 07/26/22 1505 18     Temp 07/26/22 1505 98.5 F (36.9  C)     Temp Source 07/26/22 1505 Oral     SpO2 07/26/22 1505 98 %     Weight --      Height --      Head Circumference --      Peak Flow --      Pain Score 07/26/22 1506 8     Pain Loc --      Pain Edu? --      Excl. in [place]? --    No data found.  Updated Vital Signs BP (!) 141/90 (BP Location: Right Arm)   Pulse 79   Temp 98.5 F (36.9 C) (Oral)   Resp 18   LMP 07/07/2022 (Approximate)   SpO2 98%   Visual Acuity Right Eye Distance:   Left Eye Distance:   Bilateral Distance:    Right Eye Near:   Left Eye Near:    Bilateral Near:     Physical Exam Vitals and nursing note reviewed.  Constitutional:      General: She is not in acute distress.    Appearance: Normal appearance.  HENT:     Mouth/Throat:     Lips: Pink.     Dentition: Abnormal dentition. Dental tenderness and dental caries present. No dental abscesses.     Pharynx: Oropharynx is clear. Uvula midline.   Eyes:     Pupils: Pupils are equal, round, and reactive to light.  Cardiovascular:     Rate and Rhythm: Normal rate and regular rhythm.     Pulses: Normal pulses.     Heart sounds: Normal heart sounds.  Pulmonary:     Effort: Pulmonary effort is normal.     Breath sounds: Normal breath sounds.  Abdominal:     General: Bowel sounds are normal.     Palpations: Abdomen is soft.  Musculoskeletal:     Cervical back: Normal range of motion.  Lymphadenopathy:     Cervical: No cervical adenopathy.  Skin:    General: Skin is warm and dry.  Neurological:     General: No focal deficit present.     Mental Status: She is alert and oriented to person, place, and time.  Psychiatric:        Mood and Affect: Mood normal.        Behavior: Behavior normal.      UC Treatments / Results  Labs (all labs ordered are listed, but only abnormal results are displayed) Labs Reviewed - No data to display  EKG   Radiology No results found.  Procedures Procedures (including critical care time)  Medications  Ordered in UC Medications - No data to display  Initial Impression / Assessment and Plan / UC Course  I have reviewed the triage vital signs and the nursing notes.  Pertinent labs & imaging results that were available during my care of the patient were reviewed by me and considered in my medical decision making (see chart for details).  Patient presents for complaints dental pain in the right side of her mouth in the upper and lower regions. On exam, poor dentnotOn exam, patient has moderate gingival swelling to the gum in the left posterior area of the upper mouth.  Symptoms are consistent with gingivitis, but also cannot rule out a dental abscess due to the poor dentition. Will start patient on amoxicillin for 7 days, along with ibuprofen for pain.  Patient was advised to start Tylenol in conjunction with ibuprofen to help with pain control.  Supportive care recommendations were provided to the patient.  Dental resource guide was also provided to the patient and encourage follow-up with a dentist within the next 7 to 10 days.  Patient advised to follow-up as needed.   Final Clinical Impressions(s) / UC Diagnoses   Final diagnoses:  Dentalgia     Discharge Instructions         Take medication as prescribed. May take over-the-counter Tylenol extra strength 1 to 2 hours after ibuprofen for breakthrough pain. Warm salt water gargles 3-4 times daily until symptoms improve. Continue warm compresses to the affected area to help with pain and discomfort. Please plan to follow-up with your regular dentist next week for reevaluation. Follow-up as needed.     ED Prescriptions     Medication Sig Dispense Auth. Provider   penicillin v potassium (VEETID) 500 MG tablet Take 1 tablet (500 mg total) by mouth 3 (three) times daily for 7 days. 21 tablet Brooksie Ellwanger-Warren, Alda Lea, NP      PDMP not reviewed this encounter.   Tish Men, NP 07/26/22 1544

## 2022-07-26 NOTE — Discharge Instructions (Signed)
  Take medication as prescribed. May take over-the-counter Tylenol extra strength 1 to 2 hours after ibuprofen for breakthrough pain. Warm salt water gargles 3-4 times daily until symptoms improve. Continue warm compresses to the affected area to help with pain and discomfort. Please plan to follow-up with your regular dentist next week for reevaluation. Follow-up as needed.

## 2022-07-26 NOTE — ED Triage Notes (Signed)
Right upper and lower dental pain that started today.  Has been taking tylenol and motrin for pain.

## 2022-09-03 ENCOUNTER — Ambulatory Visit (INDEPENDENT_AMBULATORY_CARE_PROVIDER_SITE_OTHER): Payer: Self-pay | Admitting: Adult Health

## 2022-09-03 ENCOUNTER — Encounter: Payer: Self-pay | Admitting: Adult Health

## 2022-09-03 ENCOUNTER — Other Ambulatory Visit (HOSPITAL_COMMUNITY)
Admission: RE | Admit: 2022-09-03 | Discharge: 2022-09-03 | Disposition: A | Discharge status: Home / Self Care | Payer: Self-pay | Source: Ambulatory Visit | Attending: Adult Health | Admitting: Adult Health

## 2022-09-03 VITALS — BP 121/76 | HR 85 | Ht 61.0 in | Wt 191.5 lb

## 2022-09-03 DIAGNOSIS — Z01419 Encounter for gynecological examination (general) (routine) without abnormal findings: Secondary | ICD-10-CM

## 2022-09-03 DIAGNOSIS — Z3041 Encounter for surveillance of contraceptive pills: Secondary | ICD-10-CM

## 2022-09-03 DIAGNOSIS — Z713 Dietary counseling and surveillance: Secondary | ICD-10-CM

## 2022-09-03 DIAGNOSIS — Z6836 Body mass index (BMI) 36.0-36.9, adult: Secondary | ICD-10-CM

## 2022-09-03 MED ORDER — NORETHINDRONE ACET-ETHINYL EST 1-20 MG-MCG PO TABS: 1.0000 | ORAL_TABLET | Freq: Every day | ORAL | 4 refills | Status: AC

## 2022-09-03 MED ORDER — PHENTERMINE HCL 15 MG PO CAPS: 15.0000 mg | ORAL_CAPSULE | ORAL | 0 refills | Status: DC

## 2022-09-03 NOTE — Progress Notes (Signed)
Patient ID: Kathleen Chan, female   DOB: January 12, 1986, 37 y.o.   MRN: [id number] History of Present Illness: Kathleen Chan is a 37 year old white female, divorced, G2P2002, in for a well woman gyn exam and pap. She wants to start back on phentermine and is happy with loestrin 1/20. She is self pay.   Current Medications, Allergies, Past Medical History, Past Surgical History, Family History and Social History were reviewed in [employer] record.     Review of Systems: Patient denies any headaches, hearing loss, fatigue, blurred vision, shortness of breath, chest pain, abdominal pain, problems with bowel movements, urination, or intercourse. No joint pain or mood swings.     Physical Exam:BP 121/76 (BP Location: Left Arm, Patient Position: Sitting, Cuff Size: Normal)   Pulse 85   Ht 5\' 1"  (1.549 m)   Wt 191 lb 8 oz (86.9 kg)   LMP 08/26/2022   BMI 36.18 kg/m   General:  Well developed, well nourished, no acute distress Skin:  Warm and dry Neck:  Midline trachea, normal thyroid, good ROM, no lymphadenopathy Lungs; Clear to auscultation bilaterally Breast:  No dominant palpable mass, retraction, or nipple discharge Cardiovascular: Regular rate and rhythm Abdomen:  Soft, non tender, no hepatosplenomegaly Pelvic:  External genitalia is normal in appearance, no lesions.  The vagina is normal in appearance. Urethra has no lesions or masses. The cervix is bulbous, everted at os, pap with GC/CHL and HR HPV genotyping performed.  Uterus is felt to be normal size, shape, and contour.  No adnexal masses or tenderness noted.Bladder is non tender, no masses felt. Extremities/musculoskeletal:  No swelling or varicosities noted, no clubbing or cyanosis Psych:  No mood changes, alert and cooperative,seems happy AA is 0 Fall risk is moderate    09/03/2022    1:30 PM 01/13/2014    4:53 PM 09/01/2013   10:01 AM  Depression screen PHQ 2/9  Decreased Interest 0 0 0  Down, Depressed,  Hopeless 0 0 0  PHQ - 2 Score 0 0 0  Altered sleeping 0    Tired, decreased energy 0    Change in appetite 0    Feeling bad or failure about yourself  0    Trouble concentrating 0    Moving slowly or fidgety/restless 0    Suicidal thoughts 0    PHQ-9 Score 0         09/03/2022    1:30 PM  GAD 7 : Generalized Anxiety Score  Nervous, Anxious, on Edge 0  Control/stop worrying 0  Worry too much - different things 0  Trouble relaxing 0  Restless 0  Easily annoyed or irritable 0  Afraid - awful might happen 0  Total GAD 7 Score 0      Upstream - 09/03/22 1333       Pregnancy Intention Screening   Does the patient want to become pregnant in the next year? No    Does the patient's partner want to become pregnant in the next year? No    Would the patient like to discuss contraceptive options today? No      Contraception Wrap Up   Current Method Oral Contraceptive    End Method Oral Contraceptive            Examination chaperoned by Malachy Mood LPN   Impression and Plan: 1. Encounter for gynecological examination with Papanicolaou smear of cervix Pap sent Pap in 3 years if normal Physical in 1 year - Cytology -  PAP( [facility])  2. Encounter for surveillance of contraceptive pills Happy with pills will refill  Meds ordered this encounter  Medications   norethindrone-ethinyl estradiol (LOESTRIN) 1-20 MG-MCG tablet    Sig: Take 1 tablet by mouth daily.    Dispense:  84 tablet    Refill:  4    Order Specific Question:   Supervising Provider    Answer:   Duane Lope H [[id number]]   phentermine 15 MG capsule    Sig: Take 1 capsule (15 mg total) by mouth every morning.    Dispense:  30 capsule    Refill:  0    Order Specific Question:   Supervising Provider    Answer:   Despina Hidden, LUTHER H [[id number]]     3. Weight loss counseling, encounter for Will rx phentermine 15 mg 1 daily Increase exercise   4. Body mass index 36.0-36.9, adult

## 2022-09-09 LAB — CYTOLOGY - PAP
Chlamydia: NEGATIVE
Comment: NEGATIVE
Comment: NEGATIVE
Comment: NORMAL
Diagnosis: NEGATIVE
High risk HPV: NEGATIVE
Neisseria Gonorrhea: NEGATIVE

## 2022-10-09 ENCOUNTER — Other Ambulatory Visit: Payer: Self-pay | Admitting: Adult Health

## 2022-10-09 MED ORDER — PHENTERMINE HCL 15 MG PO CAPS: 15.0000 mg | ORAL_CAPSULE | ORAL | 0 refills | Status: DC

## 2022-10-09 NOTE — Progress Notes (Signed)
Refilled adipex 

## 2022-11-12 ENCOUNTER — Other Ambulatory Visit: Payer: Self-pay | Admitting: Adult Health

## 2022-11-12 MED ORDER — PHENTERMINE HCL 15 MG PO CAPS: 15.0000 mg | ORAL_CAPSULE | ORAL | 0 refills | Status: DC

## 2022-11-12 NOTE — Progress Notes (Signed)
Refilled phentermine 

## 2023-01-22 ENCOUNTER — Other Ambulatory Visit: Payer: Self-pay | Admitting: Adult Health

## 2023-01-22 MED ORDER — PHENTERMINE HCL 15 MG PO CAPS: 15.0000 mg | ORAL_CAPSULE | ORAL | 0 refills | Status: AC

## 2023-01-22 NOTE — Progress Notes (Signed)
Will rx phentermine

## 2024-02-18 ENCOUNTER — Ambulatory Visit: Payer: Self-pay | Admitting: Adult Health
